# Patient Record
Sex: Male | Born: 1980 | Race: Black or African American | Hispanic: No | Marital: Single | Smoking: Never smoker
Health system: Southern US, Community
[De-identification: ages and names within clinical notes are randomized; demographics above are authoritative.]

## PROBLEM LIST (undated history)

## (undated) DIAGNOSIS — E119 Type 2 diabetes mellitus without complications: Secondary | ICD-10-CM

## (undated) DIAGNOSIS — I10 Essential (primary) hypertension: Secondary | ICD-10-CM

## (undated) HISTORY — PX: NO PAST SURGERIES: SHX2092

## (undated) HISTORY — DX: Essential (primary) hypertension: I10

---

## 2003-09-09 ENCOUNTER — Other Ambulatory Visit: Payer: Self-pay

## 2004-07-14 ENCOUNTER — Emergency Department: Payer: Self-pay | Admitting: General Practice

## 2012-10-29 ENCOUNTER — Ambulatory Visit (INDEPENDENT_AMBULATORY_CARE_PROVIDER_SITE_OTHER): Payer: PRIVATE HEALTH INSURANCE | Admitting: Family Medicine

## 2012-10-29 VITALS — BP 171/105 | HR 81 | Temp 99.0°F | Resp 18 | Ht 69.0 in | Wt >= 6400 oz

## 2012-10-29 DIAGNOSIS — I10 Essential (primary) hypertension: Secondary | ICD-10-CM

## 2012-10-29 MED ORDER — LISINOPRIL 10 MG PO TABS: 10.0000 mg | ORAL_TABLET | Freq: Every day | ORAL | Status: DC

## 2012-10-29 MED ORDER — LISINOPRIL-HYDROCHLOROTHIAZIDE 20-25 MG PO TABS: 1.0000 | ORAL_TABLET | Freq: Every day | ORAL | Status: DC

## 2012-10-29 NOTE — Progress Notes (Signed)
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  Over the last year he quit smoking and lost 80#!!!! He has no sx of shortness of breath or chest pain.  No edema Ran out of meds two days ago  Objective:  NAD Chest:  Clear Heart:  Reg, no murmur or gallop Ext:  No edema Skin:  No rash  Assessment:  Major life changes are helping.  Continue meds  Plan:

## 2012-10-29 NOTE — Patient Instructions (Signed)

## 2013-09-20 ENCOUNTER — Other Ambulatory Visit: Payer: Self-pay | Admitting: Family Medicine

## 2013-10-29 ENCOUNTER — Emergency Department: Payer: Self-pay | Admitting: Emergency Medicine

## 2014-08-28 ENCOUNTER — Emergency Department
Admit: 2014-08-28 | Disposition: A | Discharge status: Home / Self Care | Payer: Self-pay | Admitting: Emergency Medicine

## 2014-08-28 ENCOUNTER — Ambulatory Visit
Admit: 2014-08-28 | Disposition: A | Discharge status: Home / Self Care | Payer: Self-pay | Attending: Family Medicine | Admitting: Family Medicine

## 2015-02-19 ENCOUNTER — Emergency Department
Admission: EM | Admit: 2015-02-19 | Discharge: 2015-02-19 | Disposition: A | Discharge status: Home / Self Care | Payer: Self-pay | Attending: Emergency Medicine | Admitting: Emergency Medicine

## 2015-02-19 DIAGNOSIS — Z792 Long term (current) use of antibiotics: Secondary | ICD-10-CM | POA: Insufficient documentation

## 2015-02-19 DIAGNOSIS — Z87891 Personal history of nicotine dependence: Secondary | ICD-10-CM | POA: Insufficient documentation

## 2015-02-19 DIAGNOSIS — I10 Essential (primary) hypertension: Secondary | ICD-10-CM | POA: Insufficient documentation

## 2015-02-19 DIAGNOSIS — Z79899 Other long term (current) drug therapy: Secondary | ICD-10-CM | POA: Insufficient documentation

## 2015-02-19 DIAGNOSIS — H6092 Unspecified otitis externa, left ear: Secondary | ICD-10-CM | POA: Insufficient documentation

## 2015-02-19 MED ORDER — HYDROCODONE-ACETAMINOPHEN 5-325 MG PO TABS
1.0000 | ORAL_TABLET | Freq: Once | ORAL | Status: AC
  Administered 2015-02-19: 1 via ORAL
  Filled 2015-02-19: qty 1

## 2015-02-19 MED ORDER — CIPROFLOXACIN-DEXAMETHASONE 0.3-0.1 % OT SUSP: 4.0000 [drp] | Freq: Two times a day (BID) | OTIC | Status: AC

## 2015-02-19 MED ORDER — HYDROCODONE-ACETAMINOPHEN 5-325 MG PO TABS: 1.0000 | ORAL_TABLET | Freq: Four times a day (QID) | ORAL | Status: DC | PRN

## 2015-02-19 NOTE — ED Notes (Signed)
Left ear ache x 4 days

## 2015-02-19 NOTE — ED Provider Notes (Signed)
[facility] Emergency Department Provider Note ____________________________________________  Time seen: 0900  I have reviewed the triage vital signs and the nursing notes.  HISTORY  Chief Complaint  Otalgia  HPI Derek Payne is a 34 y.o. male reports to the ED for evaluation of a left earache for the last 4 days. He describes decreased hearing on the left as well as some ringing and throbbing pain. He reports the pain is also intermittently sharp. He has tried applying olive oil as well as some over-the-counter eardrops without relief to his symptoms. He notes some scant yellowish drainage from the ears well. He's been without any fevers, chills, or sweats in the interim.He notes his pain at a 10/10 in triage.  Past Medical History  Diagnosis Date  . Hypertension     There are no active problems to display for this patient.   No past surgical history on file.  Current Outpatient Rx  Name  Route  Sig  Dispense  Refill  . amoxicillin (AMOXIL) 500 MG capsule   Oral   Take 500 mg by mouth 3 (three) times daily.         . ciprofloxacin-dexamethasone (CIPRODEX) otic suspension   Left Ear   Place 4 drops into the left ear 2 (two) times daily.   7.5 mL   0   . HYDROcodone-acetaminophen (NORCO) 5-325 MG tablet   Oral   Take 1-2 tablets by mouth every 6 (six) hours as needed for moderate pain.   12 tablet   0   . lisinopril (PRINIVIL,ZESTRIL) 10 MG tablet   Oral   Take 1 tablet (10 mg total) by mouth daily.   90 tablet   3   . lisinopril-hydrochlorothiazide (PRINZIDE,ZESTORETIC) 20-25 MG per tablet   Oral   Take 1 tablet by mouth daily.   90 tablet   3    Allergies Review of patient's allergies indicates no known allergies.  Family History  Problem Relation Age of Onset  . Hypertension Mother     Social History Social History  Substance Use Topics  . Smoking status: Former Games developer  . Smokeless tobacco: Not on file  . Alcohol  Use: No   Review of Systems  Constitutional: Negative for fever. Eyes: Negative for visual changes. ENT: Negative for sore throat. Left earache as above. Cardiovascular: Negative for chest pain. Respiratory: Negative for shortness of breath. Gastrointestinal: Negative for abdominal pain, vomiting and diarrhea. Genitourinary: Negative for dysuria. Musculoskeletal: Negative for back pain. Skin: Negative for rash. Neurological: Negative for headaches, focal weakness or numbness. ____________________________________________  PHYSICAL EXAM:  VITAL SIGNS: ED Triage Vitals  Enc Vitals Group     BP 02/19/15 0812 138/83 mmHg     Pulse Rate 02/19/15 0812 93     Resp 02/19/15 0812 18     Temp 02/19/15 0812 98.6 F (37 C)     Temp Source 02/19/15 0812 Oral     SpO2 02/19/15 0812 96 %     Weight 02/19/15 0812 390 lb (176.903 kg)     Height 02/19/15 0812  (1.778 m)     Head Cir --      Peak Flow --      Pain Score 02/19/15 0813 10     Pain Loc --      Pain Edu? --      Excl. in GC? --    Constitutional: Alert and oriented. Well appearing and in no distress. Head: Normocephalic and atraumatic.  Eyes: Conjunctivae are normal. PERRL. Normal extraocular movements      Ears: left canal edematous, with serous material noted distally. The left TM is not visualized on exam. Patient tender to manipulation of the tragus and the pinna on the left ear.   Nose: No congestion/rhinorrhea.   Mouth/Throat: Mucous membranes are moist.   Neck: Supple. No thyromegaly. Hematological/Lymphatic/Immunological: No cervical or preauricular lymphadenopathy. Cardiovascular: Normal rate, regular rhythm.  Respiratory: Normal respiratory effort. No wheezes/rales/rhonchi. Gastrointestinal: Soft and nontender. No distention. Musculoskeletal: Nontender with normal range of motion in all extremities.  Neurologic:  Normal gait without ataxia. Normal speech and language. No gross focal neurologic  deficits are appreciated. Skin:  Skin is warm, dry and intact. No rash noted. Psychiatric: Mood and affect are normal. Patient exhibits appropriate insight and judgment. ____________________________________________  PROCEDURES  Ear wick instilled in the left canal ____________________________________________  INITIAL IMPRESSION / ASSESSMENT AND PLAN / ED COURSE  Patient discharge with a left otitis externa. He's provided with prescriptions for Ciprodex solution and Vicodin for pain. He is to follow-up with Dr. Elenore Rota for ongoing symptoms.  ____________________________________________  FINAL CLINICAL IMPRESSION(S) / ED DIAGNOSES  Final diagnoses:  Otitis externa of left ear      Lissa Hoard, PA-C 02/19/15 1557  Arnaldo Natal, MD 02/19/15 [phone]

## 2015-02-19 NOTE — Discharge Instructions (Signed)
Otitis Externa Otitis externa is a bacterial or fungal infection of the outer ear canal. This is the area from the eardrum to the outside of the ear. Otitis externa is sometimes called "swimmer's ear." CAUSES  Possible causes of infection include:  Swimming in dirty water.  Moisture remaining in the ear after swimming or bathing.  Mild injury (trauma) to the ear.  Objects stuck in the ear (foreign body).  Cuts or scrapes (abrasions) on the outside of the ear. SIGNS AND SYMPTOMS  The first symptom of infection is often itching in the ear canal. Later signs and symptoms may include swelling and redness of the ear canal, ear pain, and yellowish-white fluid (pus) coming from the ear. The ear pain may be worse when pulling on the earlobe. DIAGNOSIS  Your health care provider will perform a physical exam. A sample of fluid may be taken from the ear and examined for bacteria or fungi. TREATMENT  Antibiotic ear drops are often given for 10 to 14 days. Treatment may also include pain medicine or corticosteroids to reduce itching and swelling. HOME CARE INSTRUCTIONS   Apply antibiotic ear drops to the ear canal as prescribed by your health care provider.  Take medicines only as directed by your health care provider.  If you have diabetes, follow any additional treatment instructions from your health care provider.  Keep all follow-up visits as directed by your health care provider. PREVENTION   Keep your ear dry. Use the corner of a towel to absorb water out of the ear canal after swimming or bathing.  Avoid scratching or putting objects inside your ear. This can damage the ear canal or remove the protective wax that lines the canal. This makes it easier for bacteria and fungi to grow.  Avoid swimming in lakes, polluted water, or poorly chlorinated pools.  You may use ear drops made of rubbing alcohol and vinegar after swimming. Combine equal parts of white vinegar and alcohol in a bottle.  Put 3 or 4 drops into each ear after swimming. SEEK MEDICAL CARE IF:   You have a fever.  Your ear is still red, swollen, painful, or draining pus after 3 days.  Your redness, swelling, or pain gets worse.  You have a severe headache.  You have redness, swelling, pain, or tenderness in the area behind your ear. MAKE SURE YOU:   Understand these instructions.  Will watch your condition.  Will get help right away if you are not doing well or get worse.   This information is not intended to replace advice given to you by your health care provider. Make sure you discuss any questions you have with your health care provider.   Document Released: 04/29/2005 Document Revised: 05/20/2014 Document Reviewed: 05/16/2011 Elsevier Interactive Patient Education 2016 [place] Drops, Adult You need to put eardrops in your ear. HOME CARE   Put drops in your affected ear as told.  After putting in the drops, lie down with the ear you put the drops in facing up. Stay this way for 10 minutes. Use the ear drops as long as your doctor tells you.  Before you get up, put a cotton ball gently in your ear. Do not push it far in your ear.  Do not wash out your ears unless your doctor says it is okay.  Finish all medicines as told by your doctor. You may be told to keep using the eardrops even if you start to feel better.  See  your doctor as told for follow-up visits. GET HELP IF:  You have pain that gets worse.  Any unusual fluid (drainage) is coming from your ear (especially if the fluid stinks).  You have trouble hearing.  You get really dizzy as if the room is spinning and feel sick to your stomach (vertigo).  The outside of your ear becomes red or puffy or both. This may be a sign of an allergic reaction. MAKE SURE YOU:   Understand these instructions.  Will watch your condition.  Will get help right away if you are not doing well or get worse.   This information is not  intended to replace advice given to you by your health care provider. Make sure you discuss any questions you have with your health care provider.   Document Released: 10/17/2009 Document Revised: 05/20/2014 Document Reviewed: 11/24/2012 Elsevier Interactive Patient Education [employer]  Use the ear drops as directed until 10 days. Follow-up with Dr. Elenore Rota as discussed.

## 2016-01-22 ENCOUNTER — Ambulatory Visit: Payer: Self-pay | Admitting: *Deleted

## 2017-07-25 ENCOUNTER — Encounter: Payer: Self-pay | Admitting: *Deleted

## 2017-07-25 ENCOUNTER — Other Ambulatory Visit: Payer: Self-pay

## 2017-07-25 ENCOUNTER — Ambulatory Visit
Admission: EM | Admit: 2017-07-25 | Discharge: 2017-07-25 | Disposition: A | Discharge status: Home / Self Care | Payer: BLUE CROSS/BLUE SHIELD | Attending: Family Medicine | Admitting: Family Medicine

## 2017-07-25 DIAGNOSIS — I10 Essential (primary) hypertension: Secondary | ICD-10-CM

## 2017-07-25 DIAGNOSIS — E119 Type 2 diabetes mellitus without complications: Secondary | ICD-10-CM | POA: Diagnosis not present

## 2017-07-25 DIAGNOSIS — Z760 Encounter for issue of repeat prescription: Secondary | ICD-10-CM | POA: Diagnosis not present

## 2017-07-25 HISTORY — DX: Type 2 diabetes mellitus without complications: E11.9

## 2017-07-25 MED ORDER — AMLODIPINE BESYLATE 10 MG PO TABS: 10.0000 mg | ORAL_TABLET | Freq: Every day | ORAL | 1 refills | Status: DC

## 2017-07-25 MED ORDER — METFORMIN HCL 500 MG PO TABS: 500.0000 mg | ORAL_TABLET | Freq: Every day | ORAL | 1 refills | Status: DC

## 2017-07-25 MED ORDER — HYDROCHLOROTHIAZIDE 25 MG PO TABS: 25.0000 mg | ORAL_TABLET | Freq: Every day | ORAL | 1 refills | Status: DC

## 2017-07-25 NOTE — ED Triage Notes (Signed)
Patient has been out of his BP medications for 2 days days, he also is out of metformin. PCP is no longer in his health insurance.

## 2017-07-25 NOTE — Discharge Instructions (Signed)
Meds refilled.  Be sure to see your primary.  Take care  Dr. Adriana Simasook

## 2017-07-25 NOTE — ED Provider Notes (Signed)
[facility]  CSN: [id number] Arrival date & time: 07/25/17  1122  History   Chief Complaint Chief Complaint  Patient presents with  . Hypertension   HPI  37 year old male presents for a medication refill.  Patient states that his insurance has recently changed and he is in the process of switching primary care providers.  He states that he has been out of his medication for the past 2 days.  Regarding his hypertension, this has been stable.  He is currently on amlodipine and HCTZ and tolerating without difficulty.  Patient is requesting refill today.  In regards to his diabetes, he has not had recent labs.  He is taking metformin just once daily.  Tolerating without difficulty.  No reports of side effects.  Patient requesting refill today.  Past Medical History:  Diagnosis Date  . Diabetes mellitus without complication (HCC)   . Hypertension   Morbid obesity OSA GERD  Surgical Hx - No past surgeries.  Home Medications    Prior to Admission medications   Medication Sig Start Date End Date Taking? Authorizing Provider  amLODipine (NORVASC) 10 MG tablet Take 1 tablet (10 mg total) by mouth daily. 07/25/17   Tommie Samsook, Shepard Keltz G, DO  hydrochlorothiazide (HYDRODIURIL) 25 MG tablet Take 1 tablet (25 mg total) by mouth daily. 07/25/17   Tommie Samsook, Asaf Elmquist G, DO  metFORMIN (GLUCOPHAGE) 500 MG tablet Take 1 tablet (500 mg total) by mouth daily. 07/25/17   Tommie Samsook, Nikitta Sobiech G, DO   Family History Sleep apnea Father    Arthritis Mother    High blood pressure (Hypertension) Mother     Social History Social History   Tobacco Use  . Smoking status: Former Games developermoker  . Smokeless tobacco: Never Used  Substance Use Topics  . Alcohol use: No  . Drug use: No   Allergies   Patient has no known allergies.   Review of Systems Review of Systems  Constitutional: Negative.   Respiratory: Negative.   Cardiovascular: Negative.    Physical Exam Triage Vital Signs ED Triage Vitals  Enc  Vitals Group     BP 07/25/17 1154 (!) 157/85     Pulse Rate 07/25/17 1154 72     Resp 07/25/17 1154 18     Temp 07/25/17 1154 98.3 F (36.8 C)     Temp Source 07/25/17 1154 Oral     SpO2 07/25/17 1154 97 %     Weight 07/25/17 1157 (!) 470 lb (213.2 kg)     Height 07/25/17 1157 5\' 8"  (1.727 m)     Head Circumference --      Peak Flow --      Pain Score 07/25/17 1157 0     Pain Loc --      Pain Edu? --      Excl. in GC? --    Updated Vital Signs BP (!) 157/85 (BP Location: Left Arm)   Pulse 72   Temp 98.3 F (36.8 C) (Oral)   Resp 18   Ht 5\' 8"  (1.727 m)   Wt (!) 470 lb (213.2 kg)   SpO2 97%   BMI 71.46 kg/m    Physical Exam  Constitutional: He is oriented to person, place, and time. He appears well-developed. No distress.  HENT:  Head: Normocephalic and atraumatic.  Cardiovascular: Normal rate and regular rhythm.  Pulmonary/Chest: Effort normal and breath sounds normal. He has no wheezes. He has no rales.  Neurological: He is alert and oriented to person, place, and time.  Psychiatric: He has a normal mood and affect. His behavior is normal.  Nursing note and vitals reviewed.  UC Treatments / Results  Labs (all labs ordered are listed, but only abnormal results are displayed) Labs Reviewed - No data to display  EKG  EKG Interpretation None       Radiology No results found.  Procedures Procedures (including critical care time)  Medications Ordered in UC Medications - No data to display   Initial Impression / Assessment and Plan / UC Course  I have reviewed the triage vital signs and the nursing notes.  Pertinent labs & imaging results that were available during my care of the patient were reviewed by me and considered in my medical decision making (see chart for details).    37 year old male presents with stable HTN and DM-2. Meds refilled. Needs labs. Follow up with primary care.  Final Clinical Impressions(s) / UC Diagnoses   Final diagnoses:    Essential hypertension  Type 2 diabetes mellitus without complication, without long-term current use of insulin [facility])    ED Discharge Orders        Ordered    amLODipine (NORVASC) 10 MG tablet  Daily     07/25/17 1250    hydrochlorothiazide (HYDRODIURIL) 25 MG tablet  Daily     07/25/17 1250    metFORMIN (GLUCOPHAGE) 500 MG tablet  Daily     07/25/17 1250     Controlled Substance Prescriptions [place] Controlled Substance Registry consulted? Not Applicable   Tommie Sams, DO 07/25/17 1321

## 2018-01-24 ENCOUNTER — Other Ambulatory Visit: Payer: Self-pay | Admitting: Family Medicine

## 2018-01-27 NOTE — Telephone Encounter (Signed)
30 day supply with no refills pt is due for an OV for more refills

## 2018-03-02 ENCOUNTER — Other Ambulatory Visit: Payer: Self-pay | Admitting: Family Medicine

## 2018-06-17 ENCOUNTER — Ambulatory Visit
Admission: EM | Admit: 2018-06-17 | Discharge: 2018-06-17 | Disposition: A | Discharge status: Home / Self Care | Payer: BLUE CROSS/BLUE SHIELD | Attending: Family Medicine | Admitting: Family Medicine

## 2018-06-17 ENCOUNTER — Other Ambulatory Visit: Payer: Self-pay

## 2018-06-17 ENCOUNTER — Encounter: Payer: Self-pay | Admitting: Emergency Medicine

## 2018-06-17 DIAGNOSIS — I10 Essential (primary) hypertension: Secondary | ICD-10-CM | POA: Diagnosis not present

## 2018-06-17 DIAGNOSIS — Z6841 Body Mass Index (BMI) 40.0 and over, adult: Secondary | ICD-10-CM

## 2018-06-17 DIAGNOSIS — E1169 Type 2 diabetes mellitus with other specified complication: Secondary | ICD-10-CM | POA: Diagnosis not present

## 2018-06-17 DIAGNOSIS — E669 Obesity, unspecified: Secondary | ICD-10-CM | POA: Diagnosis not present

## 2018-06-17 LAB — COMPREHENSIVE METABOLIC PANEL
ALBUMIN: 3.7 g/dL (ref 3.5–5.0)
ALT: 25 U/L (ref 0–44)
AST: 22 U/L (ref 15–41)
Alkaline Phosphatase: 59 U/L (ref 38–126)
Anion gap: 7 (ref 5–15)
BUN: 14 mg/dL (ref 6–20)
CHLORIDE: 107 mmol/L (ref 98–111)
CO2: 24 mmol/L (ref 22–32)
Calcium: 8.8 mg/dL — ABNORMAL LOW (ref 8.9–10.3)
Creatinine, Ser: 0.85 mg/dL (ref 0.61–1.24)
GFR calc Af Amer: >60 mL/min (ref >60)
GFR calc non Af Amer: >60 mL/min (ref >60)
GLUCOSE: 101 mg/dL — AB (ref 70–99)
POTASSIUM: 3.6 mmol/L (ref 3.5–5.1)
Sodium: 138 mmol/L (ref 135–145)
Total Bilirubin: 0.5 mg/dL (ref 0.3–1.2)
Total Protein: 7.9 g/dL (ref 6.5–8.1)

## 2018-06-17 MED ORDER — HYDROCHLOROTHIAZIDE 25 MG PO TABS: 25.0000 mg | ORAL_TABLET | Freq: Every day | ORAL | 0 refills | Status: AC

## 2018-06-17 MED ORDER — METFORMIN HCL 500 MG PO TABS: 500.0000 mg | ORAL_TABLET | Freq: Two times a day (BID) | ORAL | 0 refills | Status: AC

## 2018-06-17 NOTE — ED Provider Notes (Signed)
[facility]    CSN: [id number] Arrival date & time: 06/17/18  1355     History   Chief Complaint Chief Complaint  Patient presents with  . Dizziness  . Hypertension  . Diabetes    HPI Derek Payne is a 38 y.o. male.   38 yo male with a c/o "refills on my metformin and HCTZ". States he ran out 2 days ago and is changing PCPs. Denies any fevers, chills, chest pains, shortness of breath. States yesterday felt a little dizzy but none since.   The history is provided by the patient.    Past Medical History:  Diagnosis Date  . Diabetes mellitus without complication (HCC)   . Hypertension     There are no active problems to display for this patient.   Past Surgical History:  Procedure Laterality Date  . NO PAST SURGERIES         Home Medications    Prior to Admission medications   Medication Sig Start Date End Date Taking? Authorizing Provider  amLODipine (NORVASC) 10 MG tablet TAKE 1 TABLET(10 MG) BY MOUTH DAILY 01/27/18  Yes Cook, Jayce G, DO  hydrochlorothiazide (HYDRODIURIL) 25 MG tablet Take 1 tablet (25 mg total) by mouth daily. 06/17/18   Payton Mccallum, MD  metFORMIN (GLUCOPHAGE) 500 MG tablet Take 1 tablet (500 mg total) by mouth 2 (two) times daily with a meal. 06/17/18   Payton Mccallum, MD    Family History Family History  Problem Relation Age of Onset  . Hypertension Mother     Social History Social History   Tobacco Use  . Smoking status: Never Smoker  . Smokeless tobacco: Never Used  Substance Use Topics  . Alcohol use: No  . Drug use: No     Allergies   Lisinopril   Review of Systems Review of Systems   Physical Exam Triage Vital Signs ED Triage Vitals  Enc Vitals Group     BP 06/17/18 1454 (!) 162/88     Pulse Rate 06/17/18 1454 73     Resp 06/17/18 1454 18     Temp 06/17/18 1454 98.8 F (37.1 C)     Temp Source 06/17/18 1454 Oral     SpO2 06/17/18 1454 96 %     Weight 06/17/18 1451 (!) 488 lb (221.4 kg)     Height 06/17/18 1451 5\' 8"  (1.727 m)     Head Circumference --      Peak Flow --      Pain Score 06/17/18 1451 0     Pain Loc --      Pain Edu? --      Excl. in GC? --    No data found.  Updated Vital Signs BP (!) 162/88 (BP Location: Left Arm)   Pulse 73   Temp 98.8 F (37.1 C) (Oral)   Resp 18   Ht 5\' 8"  (1.727 m)   Wt (!) 221.4 kg   SpO2 96%   BMI 74.20 kg/m   Visual Acuity Right Eye Distance:   Left Eye Distance:   Bilateral Distance:    Right Eye Near:   Left Eye Near:    Bilateral Near:     Physical Exam Vitals signs and nursing note reviewed.  Constitutional:      General: He is not in acute distress.    Appearance: He is not toxic-appearing or diaphoretic.  Cardiovascular:     Rate and Rhythm: Normal rate and regular rhythm.  Pulmonary:  Effort: Pulmonary effort is normal. No respiratory distress.     Breath sounds: Normal breath sounds. No stridor. No wheezing, rhonchi or rales.  Neurological:     Mental Status: He is alert.      UC Treatments / Results  Labs (all labs ordered are listed, but only abnormal results are displayed) Labs Reviewed  COMPREHENSIVE METABOLIC PANEL - Abnormal; Notable for the following components:      Result Value   Glucose, Bld 101 (*)    Calcium 8.8 (*)    All other components within normal limits    EKG None  Radiology No results found.  Procedures Procedures (including critical care time)  Medications Ordered in UC Medications - No data to display  Initial Impression / Assessment and Plan / UC Course  I have reviewed the triage vital signs and the nursing notes.  Pertinent labs & imaging results that were available during my care of the patient were reviewed by me and considered in my medical decision making (see chart for details).      Final Clinical Impressions(s) / UC Diagnoses   Final diagnoses:  Diabetes mellitus type 2 in obese [facility])  Essential hypertension    ED Prescriptions     Medication Sig Dispense Auth. Provider   metFORMIN (GLUCOPHAGE) 500 MG tablet Take 1 tablet (500 mg total) by mouth 2 (two) times daily with a meal. 60 tablet Shadie Sweatman, MD   hydrochlorothiazide (HYDRODIURIL) 25 MG tablet Take 1 tablet (25 mg total) by mouth daily. 30 tablet Payton Mccallum, MD      1. Lab results and diagnosis reviewed with patient 2. rx as per orders above; reviewed possible side effects, interactions, risks and benefits  3. Follow up with PCP 4. Follow-up here prn   Controlled Substance Prescriptions [place] Controlled Substance Registry consulted? Not Applicable   Payton Mccallum, MD 06/17/18 [phone]

## 2018-06-17 NOTE — ED Triage Notes (Signed)
Pt present to [facility] for refills on his Metformin 500 mg QD and HCTZ 25 mg. He has been out of these for 2 days and had a "dizzy spell" yesterday morning. He changed insurance his PCP no longer takes his insurance and could not get in with another until April.

## 2018-06-17 NOTE — Discharge Instructions (Signed)
Follow up with Primary Care Provider °

## 2019-04-21 ENCOUNTER — Ambulatory Visit: Payer: Self-pay

## 2019-04-21 ENCOUNTER — Other Ambulatory Visit: Payer: Self-pay

## 2019-04-21 DIAGNOSIS — Z021 Encounter for pre-employment examination: Secondary | ICD-10-CM

## 2019-04-21 LAB — POCT URINE DRUG SCREEN
POC Amphetamine UR: NOT DETECTED
POC Cocaine UR: NOT DETECTED
POC Marijuana UR: NOT DETECTED
POC Methamphetamine UR: NOT DETECTED
POC Opiate Ur: NOT DETECTED
POC PHENCYCLIDINE UR: NOT DETECTED
URINE TEMPERATURE: 96 Degrees F (ref 90.0–100.0)

## 2019-04-21 NOTE — Progress Notes (Signed)
     Patient ID: Derek Payne, male    DOB: [birth year], 38 y.o.   MRN: [id number]    Thank you!!  [place] Nurse Specialist [place]: [phone]  Cell:  [phone] Website: [email or web address]

## 2020-04-17 ENCOUNTER — Encounter: Payer: Self-pay | Admitting: Emergency Medicine

## 2020-04-17 ENCOUNTER — Other Ambulatory Visit: Payer: Self-pay

## 2020-04-17 ENCOUNTER — Ambulatory Visit
Admission: EM | Admit: 2020-04-17 | Discharge: 2020-04-17 | Disposition: A | Discharge status: Other Acute Inpatient Hospital | Payer: HRSA Program | Attending: Physician Assistant | Admitting: Physician Assistant

## 2020-04-17 ENCOUNTER — Ambulatory Visit (INDEPENDENT_AMBULATORY_CARE_PROVIDER_SITE_OTHER): Payer: HRSA Program

## 2020-04-17 ENCOUNTER — Inpatient Hospital Stay
Admission: EM | Admit: 2020-04-17 | Discharge: 2020-04-18 | DRG: 177 | Disposition: A | Discharge status: Home / Self Care | Payer: HRSA Program | Attending: Internal Medicine | Admitting: Internal Medicine

## 2020-04-17 DIAGNOSIS — J9601 Acute respiratory failure with hypoxia: Secondary | ICD-10-CM | POA: Diagnosis present

## 2020-04-17 DIAGNOSIS — Z7984 Long term (current) use of oral hypoglycemic drugs: Secondary | ICD-10-CM

## 2020-04-17 DIAGNOSIS — N179 Acute kidney failure, unspecified: Secondary | ICD-10-CM | POA: Diagnosis present

## 2020-04-17 DIAGNOSIS — Z8249 Family history of ischemic heart disease and other diseases of the circulatory system: Secondary | ICD-10-CM | POA: Diagnosis not present

## 2020-04-17 DIAGNOSIS — I10 Essential (primary) hypertension: Secondary | ICD-10-CM | POA: Diagnosis present

## 2020-04-17 DIAGNOSIS — Z888 Allergy status to other drugs, medicaments and biological substances status: Secondary | ICD-10-CM

## 2020-04-17 DIAGNOSIS — U071 COVID-19: Secondary | ICD-10-CM

## 2020-04-17 DIAGNOSIS — E119 Type 2 diabetes mellitus without complications: Secondary | ICD-10-CM | POA: Diagnosis present

## 2020-04-17 DIAGNOSIS — R778 Other specified abnormalities of plasma proteins: Secondary | ICD-10-CM

## 2020-04-17 DIAGNOSIS — Z6841 Body Mass Index (BMI) 40.0 and over, adult: Secondary | ICD-10-CM | POA: Diagnosis not present

## 2020-04-17 DIAGNOSIS — J1282 Pneumonia due to coronavirus disease 2019: Secondary | ICD-10-CM | POA: Insufficient documentation

## 2020-04-17 DIAGNOSIS — R509 Fever, unspecified: Secondary | ICD-10-CM

## 2020-04-17 DIAGNOSIS — Z79899 Other long term (current) drug therapy: Secondary | ICD-10-CM

## 2020-04-17 DIAGNOSIS — E860 Dehydration: Secondary | ICD-10-CM

## 2020-04-17 DIAGNOSIS — R059 Cough, unspecified: Secondary | ICD-10-CM

## 2020-04-17 DIAGNOSIS — R7989 Other specified abnormal findings of blood chemistry: Secondary | ICD-10-CM

## 2020-04-17 LAB — BLOOD GAS, VENOUS
Acid-Base Excess: 2.4 mmol/L — ABNORMAL HIGH (ref 0.0–2.0)
Bicarbonate: 26.4 mmol/L (ref 20.0–28.0)
O2 Saturation: 81.1 %
Patient temperature: 37
pCO2, Ven: 38 mmHg — ABNORMAL LOW (ref 44.0–60.0)
pH, Ven: 7.45 — ABNORMAL HIGH (ref 7.250–7.430)
pO2, Ven: 43 mmHg (ref 32.0–45.0)

## 2020-04-17 LAB — GLUCOSE, CAPILLARY: Glucose-Capillary: 117 mg/dL — ABNORMAL HIGH (ref 70–99)

## 2020-04-17 LAB — CBC WITH DIFFERENTIAL/PLATELET
Abs Immature Granulocytes: 0.03 10*3/uL (ref 0.00–0.07)
Basophils Absolute: 0 10*3/uL (ref 0.0–0.1)
Basophils Relative: 0 %
Eosinophils Absolute: 0 10*3/uL (ref 0.0–0.5)
Eosinophils Relative: 0 %
HCT: 48.2 % (ref 39.0–52.0)
Hemoglobin: 15.9 g/dL (ref 13.0–17.0)
Immature Granulocytes: 1 %
Lymphocytes Relative: 35 %
Lymphs Abs: 1.8 10*3/uL (ref 0.7–4.0)
MCH: 26.9 pg (ref 26.0–34.0)
MCHC: 33 g/dL (ref 30.0–36.0)
MCV: 81.7 fL (ref 80.0–100.0)
Monocytes Absolute: 0.5 10*3/uL (ref 0.1–1.0)
Monocytes Relative: 9 %
Neutro Abs: 2.9 10*3/uL (ref 1.7–7.7)
Neutrophils Relative %: 55 %
Platelets: 227 10*3/uL (ref 150–400)
RBC Morphology: NONE SEEN
RBC: 5.9 MIL/uL — ABNORMAL HIGH (ref 4.22–5.81)
RDW: 14.6 % (ref 11.5–15.5)
Smear Review: NORMAL
WBC Morphology: >10
WBC: 5.3 10*3/uL (ref 4.0–10.5)
nRBC: 0 % (ref 0.0–0.2)

## 2020-04-17 LAB — RESP PANEL BY RT-PCR (FLU A&B, COVID) ARPGX2
Influenza A by PCR: NEGATIVE
Influenza B by PCR: NEGATIVE
SARS Coronavirus 2 by RT PCR: POSITIVE — AB

## 2020-04-17 LAB — COMPREHENSIVE METABOLIC PANEL
ALT: 82 U/L — ABNORMAL HIGH (ref 0–44)
AST: 83 U/L — ABNORMAL HIGH (ref 15–41)
Albumin: 3.6 g/dL (ref 3.5–5.0)
Alkaline Phosphatase: 49 U/L (ref 38–126)
Anion gap: 15 (ref 5–15)
BUN: 13 mg/dL (ref 6–20)
CO2: 22 mmol/L (ref 22–32)
Calcium: 8.6 mg/dL — ABNORMAL LOW (ref 8.9–10.3)
Chloride: 98 mmol/L (ref 98–111)
Creatinine, Ser: 1.83 mg/dL — ABNORMAL HIGH (ref 0.61–1.24)
GFR, Estimated: 47 mL/min — ABNORMAL LOW (ref >60)
Glucose, Bld: 119 mg/dL — ABNORMAL HIGH (ref 70–99)
Potassium: 3.5 mmol/L (ref 3.5–5.1)
Sodium: 135 mmol/L (ref 135–145)
Total Bilirubin: 0.7 mg/dL (ref 0.3–1.2)
Total Protein: 8.3 g/dL — ABNORMAL HIGH (ref 6.5–8.1)

## 2020-04-17 LAB — PROCALCITONIN: Procalcitonin: 0.11 ng/mL

## 2020-04-17 LAB — TROPONIN I (HIGH SENSITIVITY): Troponin I (High Sensitivity): 55 ng/L — ABNORMAL HIGH (ref <18)

## 2020-04-17 LAB — MAGNESIUM: Magnesium: 2 mg/dL (ref 1.7–2.4)

## 2020-04-17 LAB — BRAIN NATRIURETIC PEPTIDE: B Natriuretic Peptide: 19.1 pg/mL (ref 0.0–100.0)

## 2020-04-17 MED ORDER — LACTATED RINGERS IV BOLUS
1000.0000 mL | Freq: Once | INTRAVENOUS | Status: AC
  Administered 2020-04-17: 1000 mL via INTRAVENOUS

## 2020-04-17 MED ORDER — SODIUM CHLORIDE 0.9 % IV SOLN
200.0000 mg | Freq: Once | INTRAVENOUS | Status: AC
  Administered 2020-04-17: 200 mg via INTRAVENOUS
  Filled 2020-04-17: qty 200

## 2020-04-17 MED ORDER — SODIUM CHLORIDE 0.9 % IV SOLN
100.0000 mg | Freq: Every day | INTRAVENOUS | Status: DC
  Administered 2020-04-18: 100 mg via INTRAVENOUS
  Filled 2020-04-17: qty 20

## 2020-04-17 MED ORDER — DEXAMETHASONE SODIUM PHOSPHATE 10 MG/ML IJ SOLN
6.0000 mg | Freq: Once | INTRAMUSCULAR | Status: AC
  Administered 2020-04-17: 6 mg via INTRAVENOUS
  Filled 2020-04-17: qty 1

## 2020-04-17 MED ORDER — ACETAMINOPHEN 500 MG PO TABS
1000.0000 mg | ORAL_TABLET | ORAL | Status: AC
  Administered 2020-04-17: 1000 mg via ORAL

## 2020-04-17 MED ORDER — ASPIRIN 81 MG PO CHEW
324.0000 mg | CHEWABLE_TABLET | Freq: Once | ORAL | Status: AC
  Administered 2020-04-17: 324 mg via ORAL
  Filled 2020-04-17: qty 4

## 2020-04-17 NOTE — H&P (Signed)
History and Physical    Mccauley Diehl YIF:[id number] DOB: [birth year] DOA: 04/17/2020  I have briefly reviewed the patient's prior medical records in [facility]  PCP: [facility]  Patient coming from: home  Chief Complaint: Shortness of breath  HPI: Derek Payne is a 39 y.o. male with medical history significant of hypertension, type 2 diabetes mellitus, morbid obesity, comes to the hospital with complaints of generalized weakness, shortness of breath.  He has been having a cough and has been feeling fatigued, weak for about 4 days.  He reports that his fiance is also sick with similar symptoms.  He reports fever and chills, went to urgent care today and was found to have COVID-19.  He was also found to be hypoxic and directed to the emergency room.  He is not vaccinated.  He also reports very poor appetite and decreased p.o. intake as well as decreased urine output.  He denies any loss of taste or smell.  No abdominal pain, no nausea or vomiting.  No diarrhea.  ED Course: In the ER patient has a temp of 100, tachypneic with respiratory rate into the 20s-30s, blood pressure soft at times.  He is hypoxic satting in the upper 80s on room air requiring 2L supplemental oxygen.  Chest x-ray shows multifocal pneumonia.  Blood work shows a creatinine of 1.8, mildly elevated LFTs with AST/ALT in the 80s, high-sensitivity troponin 55 and BNP normal at 19.  He was given steroids, Remdesivir and we are asked for admission  Review of Systems: All systems reviewed, and apart from HPI, all negative  Past Medical History:  Diagnosis Date  . Diabetes mellitus without complication (HCC)   . Hypertension     Past Surgical History:  Procedure Laterality Date  . NO PAST SURGERIES       reports that he has never smoked. He has never used smokeless tobacco. He reports that he does not drink alcohol and does not use drugs.  Allergies  Allergen Reactions  . Lisinopril Swelling  and Hives    Family History  Problem Relation Age of Onset  . Hypertension Mother     Prior to Admission medications   Medication Sig Start Date End Date Taking? Authorizing Provider  amLODipine (NORVASC) 10 MG tablet TAKE 1 TABLET(10 MG) BY MOUTH DAILY 01/27/18   Everlene Other G, DO  hydrochlorothiazide (HYDRODIURIL) 25 MG tablet Take 1 tablet (25 mg total) by mouth daily. 06/17/18   Payton Mccallum, MD  metFORMIN (GLUCOPHAGE) 500 MG tablet Take 1 tablet (500 mg total) by mouth 2 (two) times daily with a meal. 06/17/18   Payton Mccallum, MD    Physical Exam: Vitals:   04/17/20 1854 04/17/20 1855 04/17/20 1900  BP: (!) 86/40  (!) 87/57  Pulse: 82  78  Resp:   10  Temp: 97.9 F (36.6 C)    TempSrc: Oral    SpO2: 93%  93%  Weight:  (!) 199.6 kg   Height:  5\' 8"  (1.727 m)     Constitutional: NAD, calm, comfortable Eyes: PERRL, lids and conjunctivae normal Neck: normal, supple Respiratory: Faint bibasilar rhonchi, no wheezing, overall difficult exam due to morbid obesity.  Tachypneic. Cardiovascular: Regular rate and rhythm, no murmurs / rubs / gallops. No extremity edema. Abdomen: no tenderness, no masses palpated. Bowel sounds positive.  Musculoskeletal: no clubbing / cyanosis. Normal muscle tone.  Skin: no rashes, lesions, ulcers. No induration Neurologic: CN 2-12 grossly intact. Strength 5/5 in all 4.  Psychiatric: Normal judgment and insight. Alert and oriented x 3. Normal mood.   Labs on Admission: I have personally reviewed following labs and imaging studies  CBC: Recent Labs  Lab 04/17/20 1737  WBC 5.3  NEUTROABS 2.9  HGB 15.9  HCT 48.2  MCV 81.7  PLT 227   Basic Metabolic Panel: Recent Labs  Lab 04/17/20 1737 04/17/20 1848  NA 135  --   K 3.5  --   CL 98  --   CO2 22  --   GLUCOSE 119*  --   BUN 13  --   CREATININE 1.83*  --   CALCIUM 8.6*  --   MG  --  2.0   Liver Function Tests: Recent Labs  Lab 04/17/20 1737  AST 83*  ALT 82*  ALKPHOS 49    BILITOT 0.7  PROT 8.3*  ALBUMIN 3.6   Coagulation Profile: No results for input(s): INR, PROTIME in the last 168 hours. BNP (last 3 results) No results for input(s): PROBNP in the last 8760 hours. CBG: Recent Labs  Lab 04/17/20 1711  GLUCAP 117*   Thyroid Function Tests: No results for input(s): TSH, T4TOTAL, FREET4, T3FREE, THYROIDAB in the last 72 hours. Urine analysis: No results found for: COLORURINE, APPEARANCEUR, LABSPEC, PHURINE, GLUCOSEU, HGBUR, BILIRUBINUR, KETONESUR, PROTEINUR, UROBILINOGEN, NITRITE, LEUKOCYTESUR   Radiological Exams on Admission: DG Chest 2 View  Result Date: 04/17/2020 CLINICAL DATA:  Fever, cough. EXAM: CHEST - 2 VIEW COMPARISON:  None. FINDINGS: The heart size and mediastinal contours are within normal limits. No pneumothorax or pleural effusion is noted. Minimal patchy opacities are noted in both lungs which may represent multifocal pneumonia. The visualized skeletal structures are unremarkable. IMPRESSION: Minimal patchy opacities are noted in both lungs which may represent multifocal pneumonia. Electronically Signed   By: Lupita Raider M.D.   On: 04/17/2020 18:11    EKG: Independently reviewed.  Sinus rhythm  Assessment/Plan  Principal Problem Acute hypoxic respiratory failure due to COVID-19 pneumonia -Currently minimal oxygen needs, patient started on remdesivir and steroids, continue.  Should oxygen increase by tomorrow he would be a candidate for Actemra/baricitinib -Incentive spirometry, flutter valve, prone as able, wean off oxygen as tolerated -Antitussives, vitamins, inhalers  Active Problems Acute kidney injury -Likely in the setting of poor p.o. intake and taking home diuretics -Provide IV fluids overnight, repeat renal function in the morning -Hold Metformin, HCTZ  Essential hypertension -Soft blood pressure in the ED, hold home medications  Type 2 diabetes mellitus -Start resistance sliding scale given obesity,  steroids  Morbid obesity -Based on BMI of 66, patient will benefit from weight loss   DVT prophylaxis: Lovenox Code Status: Full code Family Communication: No family at bedside Disposition Plan: Home when ready Bed Type: MedSurg Consults called: None Obs/Inp: Inpatient  At the time of admission, it appears that the appropriate admission status for this patient is INPATIENT as it is expected that patient will require hospital care > 2 midnights. This is judged to be reasonable and necessary in order to provide the required intensity of service to ensure the patient's safety given: presenting symptoms, initial radiographic and laboratory data and in the context of their chronic comorbidities. Together, these circumstances are felt to place patient at high at high risk for further clinical deterioration threatening life, limb, or organ.  Pamella Pert, MD, PhD [facility]  Contact via [email or web address]  04/17/2020, 9:04 PM

## 2020-04-17 NOTE — ED Notes (Signed)
Patient is being discharged from the Urgent Care and sent to the Emergency Department via EMS . Per Becky Augusta, NP, patient is in need of higher level of care due to hypoxia, COVID positive and COVID pneumonia. Patient is aware and verbalizes understanding of plan of care.  Vitals:   04/17/20 1738 04/17/20 1800  BP:    Pulse:  (!) 105  Resp: (S) (!) 36 (!) 36  Temp:  100 F (37.8 C)  SpO2:  95%

## 2020-04-17 NOTE — ED Provider Notes (Signed)
[facility]    CSN: [id number] Arrival date & time: 04/17/20  1630      History   Chief Complaint Chief Complaint  Patient presents with  . Cough  . Weakness    HPI Derek Payne is a 39 y.o. male.   HPI   39 year old male here for evaluation of cough and weakness.  Patient reports that he has been having a cough and feeling weak for the last 4 days.  He also feels like his blood sugar is off.  Blood sugar at triage was 117.  Patient has had a productive cough that is intermittently between clear and yellow.  He is also complaining of shortness of breath.  Patient reports that his girlfriend has similar symptoms but she is worse.  Patient denies wheezing, fever, ear pain or pressure, runny nose, nasal congestion, body aches, changes to sense of taste or smell.  Patient is febrile at 103.1, tachycardic at 106, tachypneic at 28, and satting 93% on room air.  Patient is sweating profusely in triage.  Patient Derek Payne that this is the first that he is been sweating.  Past Medical History:  Diagnosis Date  . Diabetes mellitus without complication (HCC)   . Hypertension     Patient Active Problem List   Diagnosis Date Noted  . Acute hypoxemic respiratory failure due to COVID-19 [facility]) 04/17/2020    Past Surgical History:  Procedure Laterality Date  . NO PAST SURGERIES         Home Medications    Prior to Admission medications   Medication Sig Start Date End Date Taking? Authorizing Provider  amLODipine (NORVASC) 10 MG tablet TAKE 1 TABLET(10 MG) BY MOUTH DAILY Patient taking differently: Take 10 mg by mouth daily.  01/27/18  Yes Cook, Jayce G, DO  hydrochlorothiazide (HYDRODIURIL) 25 MG tablet Take 1 tablet (25 mg total) by mouth daily. 06/17/18  Yes Payton Mccallum, MD  metFORMIN (GLUCOPHAGE) 500 MG tablet Take 1 tablet (500 mg total) by mouth 2 (two) times daily with a meal. 06/17/18  Yes Payton Mccallum, MD  acetaminophen (TYLENOL) 325 MG tablet Take 2 tablets  (650 mg total) by mouth every 6 (six) hours as needed for mild pain or headache (fever >/= 101). 04/18/20   Tresa Moore, MD  albuterol (VENTOLIN HFA) 108 (90 Base) MCG/ACT inhaler Inhale 2 puffs into the lungs every 6 (six) hours as needed for wheezing or shortness of breath. 04/18/20   Sreenath, Jonelle Sports, MD  benzonatate (TESSALON PERLES) 100 MG capsule Take 1 capsule (100 mg total) by mouth 3 (three) times daily as needed for cough. 04/18/20 04/18/21  Tresa Moore, MD  guaiFENesin-dextromethorphan (ROBITUSSIN DM) 100-10 MG/5ML syrup Take 10 mLs by mouth every 4 (four) hours as needed for cough. 04/18/20   Tresa Moore, MD  hydrALAZINE (APRESOLINE) 50 MG tablet Take 1 tablet by mouth in the morning and at bedtime. Patient not taking: Reported on 04/17/2020 04/05/19   [provider]  predniSONE (DELTASONE) 50 MG tablet Take 1 tablet (50 mg total) by mouth daily for 9 days. 04/21/20 04/30/20  Tresa Moore, MD    Family History Family History  Problem Relation Age of Onset  . Hypertension Mother     Social History Social History   Tobacco Use  . Smoking status: Never Smoker  . Smokeless tobacco: Never Used  Vaping Use  . Vaping Use: Never used  Substance Use Topics  . Alcohol use: No  . Drug  use: No     Allergies   Lisinopril   Review of Systems Review of Systems  Constitutional: Positive for fatigue. Negative for activity change, appetite change and fever.  HENT: Negative for congestion, ear pain, rhinorrhea, sinus pressure, sinus pain and sore throat.   Respiratory: Positive for cough and shortness of breath. Negative for wheezing.   Cardiovascular: Negative for chest pain.  Gastrointestinal: Negative for abdominal pain, diarrhea, nausea and vomiting.  Musculoskeletal: Negative for arthralgias and myalgias.  Skin: Negative for rash.  Neurological: Negative for headaches.  Hematological: Negative.   Psychiatric/Behavioral: Negative.       Physical Exam Triage Vital Signs ED Triage Vitals  Enc Vitals Group     BP      Pulse      Resp      Temp      Temp src      SpO2      Weight      Height      Head Circumference      Peak Flow      Pain Score      Pain Loc      Pain Edu?      Excl. in GC?    No data found.  Updated Vital Signs BP (!) 109/58 (BP Location: Right Arm)   Pulse (!) 105   Temp 100 F (37.8 C) (Oral)   Resp (!) 36   Ht  (1.727 m)   Wt (!) 440 lb (199.6 kg)   SpO2 95%   BMI 66.90 kg/m   Visual Acuity Right Eye Distance:   Left Eye Distance:   Bilateral Distance:    Right Eye Near:   Left Eye Near:    Bilateral Near:     Physical Exam Vitals and nursing note reviewed.  Constitutional:      Appearance: He is obese. He is ill-appearing and diaphoretic.  HENT:     Head: Normocephalic and atraumatic.     Right Ear: Tympanic membrane, ear canal and external ear normal.     Left Ear: Tympanic membrane, ear canal and external ear normal.     Nose: Congestion and rhinorrhea present.     Comments: Nasal mucosa is erythematous and mildly edematous with clear nasal discharge.    Mouth/Throat:     Mouth: Mucous membranes are moist.     Pharynx: Oropharynx is clear. Posterior oropharyngeal erythema present. No oropharyngeal exudate.     Comments: Posterior oropharynx has mild erythema and clear postnasal drip. Eyes:     General: No scleral icterus.    Extraocular Movements: Extraocular movements intact.     Conjunctiva/sclera: Conjunctivae normal.     Pupils: Pupils are equal, round, and reactive to light.  Cardiovascular:     Rate and Rhythm: Regular rhythm. Tachycardia present.     Pulses: Normal pulses.     Heart sounds: Normal heart sounds. No murmur heard.  No gallop.   Pulmonary:     Effort: Respiratory distress present.     Breath sounds: No wheezing, rhonchi or rales.     Comments: Patient is tachypneic.  Patient can speak in full sentences.  Lung sounds are clear  to auscultation in all fields. Musculoskeletal:        General: No swelling or tenderness. Normal range of motion.  Skin:    General: Skin is warm.     Capillary Refill: Capillary refill takes less than 2 seconds.     Coloration: Skin is not jaundiced.  Findings: No erythema or rash.  Neurological:     General: No focal deficit present.     Mental Status: He is alert and oriented to person, place, and time.  Psychiatric:        Mood and Affect: Mood normal.        Behavior: Behavior normal.        Thought Content: Thought content normal.        Judgment: Judgment normal.      UC Treatments / Results  Labs (all labs ordered are listed, but only abnormal results are displayed) Labs Reviewed  RESP PANEL BY RT-PCR (FLU A&B, COVID) ARPGX2 - Abnormal; Notable for the following components:      Result Value   SARS Coronavirus 2 by RT PCR POSITIVE (*)    All other components within normal limits  GLUCOSE, CAPILLARY - Abnormal; Notable for the following components:   Glucose-Capillary 117 (*)    All other components within normal limits  CBC WITH DIFFERENTIAL/PLATELET - Abnormal; Notable for the following components:   RBC 5.90 (*)    All other components within normal limits  COMPREHENSIVE METABOLIC PANEL - Abnormal; Notable for the following components:   Glucose, Bld 119 (*)    Creatinine, Ser 1.83 (*)    Calcium 8.6 (*)    Total Protein 8.3 (*)    AST 83 (*)    ALT 82 (*)    GFR, Estimated 47 (*)    All other components within normal limits  CBG MONITORING, ED    EKG   Radiology DG Chest 2 View  Result Date: 04/17/2020 CLINICAL DATA:  Fever, cough. EXAM: CHEST - 2 VIEW COMPARISON:  None. FINDINGS: The heart size and mediastinal contours are within normal limits. No pneumothorax or pleural effusion is noted. Minimal patchy opacities are noted in both lungs which may represent multifocal pneumonia. The visualized skeletal structures are unremarkable. IMPRESSION:  Minimal patchy opacities are noted in both lungs which may represent multifocal pneumonia. Electronically Signed   By: Lupita Raider M.D.   On: 04/17/2020 18:11    Procedures Procedures (including critical care time)  Medications Ordered in UC Medications  acetaminophen (TYLENOL) tablet 1,000 mg (1,000 mg Oral Given 04/17/20 1713)    Initial Impression / Assessment and Plan / UC Course  I have reviewed the triage vital signs and the nursing notes.  Pertinent labs & imaging results that were available during my care of the patient were reviewed by me and considered in my medical decision making (see chart for details).   Patient is here for evaluation of cough and weakness x4 days.  Patient is very ill-appearing and diaphoretic.  Patient is also tachypneic but lung sounds are clear to auscultation.  Nasal mucosa is mildly erythematous and edematous with clear postnasal drip.  Concern for flu, Covid, or pneumonia.  We will send [facility]plex respiratory panel, CBC, CMP, and obtain chest x-ray.  CBC shows normal white count.  Differential is pending.  CMP still pending.  Respiratory panel is Covid positive.  Chest x-ray shows diffuse patchy opacities in both lungs as well as cardiomegaly.  Patient remains tachycardic at 106 bpm and mildly hypoxic at 93%.  Patient's tachypnea has increased to 36 breaths/min.  Based on the bilateral Covid pneumonia, hypoxia, tachypnea.  With his comorbidities on medicine the patient to [facility] by EMS for evaluation and admission.  Report given to ER charge nurse Lelon Mast at [facility].  Patient was transported to [facility] via EMS. At discharge patient remained tachypneic with a respiratory rate of 28 and tachycardic with a rate of 103. Patient's oxygen saturation remained 93% on room air.  Final Clinical Impressions(s) / UC Diagnoses   Final diagnoses:  Pneumonia due to COVID-19 virus     Discharge Instructions      Please go to [facility] emergency department for further evaluation and possible admission for your Covid pneumonia.    ED Prescriptions    None     PDMP not reviewed this encounter.   Becky Augusta, NP 04/19/20 0745

## 2020-04-17 NOTE — Consult Note (Signed)
Remdesivir - Pharmacy Brief Note   A/P:  Per ED notes, patient tested positive for COVID today at clinic.   Remdesivir 200 mg IVPB once followed by 100 mg IVPB daily x 4 days.   Cephus Shelling, PharmD Clinical Pharmacist  04/17/2020 6:57 PM

## 2020-04-17 NOTE — ED Triage Notes (Signed)
Tested positive today at clinic.  CO SOB, fatigue and diaphoresis

## 2020-04-17 NOTE — Discharge Instructions (Addendum)
Please go to [facility] emergency department for further evaluation and possible admission for your Covid pneumonia.

## 2020-04-17 NOTE — ED Provider Notes (Signed)
[facility] Emergency Department Provider Note  ____________________________________________   First MD Initiated Contact with Patient 04/17/20 1846     (approximate)  I have reviewed the triage vital signs and the nursing notes.   HISTORY  Chief Complaint Covid Positive   HPI Derek Payne is a 39 y.o. male with past medical history of DM, HTN, and morbid obesity who presents via EMS from outpatient clinic after he was diagnosed with COVID-19 for further evaluation and management.  Patient states he has been feeling poorly for the last 2 days with myalgias, cough, fevers, very poor appetite and decreased p.o. intake as well as decreased urine output and diaphoresis.  He denies any headache, earache, sore throat, chest pain, Donnell pain, back pain, extremity pain, rash.  Denies any burning with urination.  States he had a little bit of nausea and vomiting but is not currently nauseous.  No diarrhea.  Denies any blood in his urine or stool.  He is not a smoker and denies EtOH or illicit drug use.  He has not been back in his COVID-19.         Past Medical History:  Diagnosis Date  . Diabetes mellitus without complication (HCC)   . Hypertension     Patient Active Problem List   Diagnosis Date Noted  . Acute hypoxemic respiratory failure due to COVID-19 [facility]) 04/17/2020    Past Surgical History:  Procedure Laterality Date  . NO PAST SURGERIES      Prior to Admission medications   Medication Sig Start Date End Date Taking? Authorizing Provider  amLODipine (NORVASC) 10 MG tablet TAKE 1 TABLET(10 MG) BY MOUTH DAILY 01/27/18   Everlene Other G, DO  hydrochlorothiazide (HYDRODIURIL) 25 MG tablet Take 1 tablet (25 mg total) by mouth daily. 06/17/18   Payton Mccallum, MD  metFORMIN (GLUCOPHAGE) 500 MG tablet Take 1 tablet (500 mg total) by mouth 2 (two) times daily with a meal. 06/17/18   Payton Mccallum, MD    Allergies Lisinopril  Family History    Problem Relation Age of Onset  . Hypertension Mother     Social History Social History   Tobacco Use  . Smoking status: Never Smoker  . Smokeless tobacco: Never Used  Vaping Use  . Vaping Use: Never used  Substance Use Topics  . Alcohol use: No  . Drug use: No    Review of Systems  Review of Systems  Constitutional: Positive for chills, diaphoresis, fever and malaise/fatigue.  HENT: Negative for sore throat.   Eyes: Negative for pain.  Respiratory: Positive for cough and shortness of breath. Negative for stridor.   Cardiovascular: Negative for chest pain.  Gastrointestinal: Negative for vomiting.  Genitourinary: Negative for flank pain.  Musculoskeletal: Positive for myalgias.  Skin: Negative for rash.  Neurological: Positive for dizziness. Negative for seizures, loss of consciousness and headaches.  Psychiatric/Behavioral: Negative for suicidal ideas.  All other systems reviewed and are negative.     ____________________________________________   PHYSICAL EXAM:  VITAL SIGNS: ED Triage Vitals  Enc Vitals Group     BP      Pulse      Resp      Temp      Temp src      SpO2      Weight      Height      Head Circumference      Peak Flow      Pain Score  Pain Loc      Pain Edu?      Excl. in GC?    Vitals:   04/17/20 2100 04/17/20 2130  BP: (!) 142/82 140/88  Pulse: 73 71  Resp: (!) 22 (!) 24  Temp:    SpO2: 91% 92%   Physical Exam Vitals and nursing note reviewed.  Constitutional:      Appearance: He is well-developed. He is obese. He is ill-appearing and diaphoretic.  HENT:     Head: Normocephalic and atraumatic.     Right Ear: External ear normal.     Left Ear: External ear normal.     Nose: Nose normal.     Mouth/Throat:     Mouth: Mucous membranes are dry.  Eyes:     Conjunctiva/sclera: Conjunctivae normal.  Cardiovascular:     Rate and Rhythm: Normal rate and regular rhythm.     Heart sounds: No murmur heard.   Pulmonary:      Effort: Pulmonary effort is normal. Tachypnea present. No respiratory distress.     Breath sounds: Normal breath sounds.  Abdominal:     Palpations: Abdomen is soft.     Tenderness: There is no abdominal tenderness.  Musculoskeletal:     Cervical back: Neck supple. No rigidity.  Skin:    General: Skin is warm.     Capillary Refill: Capillary refill takes 2 to 3 seconds.  Neurological:     Mental Status: He is alert and oriented to person, place, and time.  Psychiatric:        Mood and Affect: Mood normal.      ____________________________________________   LABS (all labs ordered are listed, but only abnormal results are displayed)  Labs Reviewed  BLOOD GAS, VENOUS - Abnormal; Notable for the following components:      Result Value   pH, Ven 7.45 (*)    pCO2, Ven 38 (*)    Acid-Base Excess 2.4 (*)    All other components within normal limits  TROPONIN I (HIGH SENSITIVITY) - Abnormal; Notable for the following components:   Troponin I (High Sensitivity) 55 (*)    All other components within normal limits  PROCALCITONIN  BRAIN NATRIURETIC PEPTIDE  MAGNESIUM  TROPONIN I (HIGH SENSITIVITY)   ____________________________________________  EKG  Sinus rhythm with ventricular to 78, normal axis, unremarkable intervals, and some nonspecific ST changes in lead III, aVF, and V6.  No other clear evidence of acute ischemia or other significant underlying arrhythmia ____________________________________________  RADIOLOGY  ED MD interpretation: Bilateral opacities consistent with a focal pneumonia.  No evidence of overt edema, large effusion, thorax or other acute intrathoracic process per  Official radiology report(s): DG Chest 2 View  Result Date: 04/17/2020 CLINICAL DATA:  Fever, cough. EXAM: CHEST - 2 VIEW COMPARISON:  None. FINDINGS: The heart size and mediastinal contours are within normal limits. No pneumothorax or pleural effusion is noted. Minimal patchy opacities are noted  in both lungs which may represent multifocal pneumonia. The visualized skeletal structures are unremarkable. IMPRESSION: Minimal patchy opacities are noted in both lungs which may represent multifocal pneumonia. Electronically Signed   By: Lupita Raider M.D.   On: 04/17/2020 18:11    ____________________________________________   PROCEDURES  Procedure(s) performed (including Critical Care):  .1-3 Lead EKG Interpretation Performed by: Gilles Chiquito, MD Authorized by: Gilles Chiquito, MD     Interpretation: normal     ECG rate assessment: normal     Rhythm: sinus rhythm  Ectopy: none     Conduction: normal       ____________________________________________   INITIAL IMPRESSION / ASSESSMENT AND PLAN / ED COURSE        Patient presents for further evaluation after referred from clinic for above-noted symptoms in the setting of being found Covid positive.  Patient is hypotensive will be of 86/40 and tachypneic with respiratory in the 20s with a sat of 90% on room air.  X-ray obtained in clinic shows evidence of multifocal pneumonia consistent with COVID-19 pneumonia.  Labs reviewed in clinic were reviewed.  CBC unremarkable as no evidence of acute anemia.  CMP with evidence of an AKI with a creatinine of 1.83 compared to 0.851-year ago as well as mild transaminitis but no other significant ultralight or metabolic derangements.  Blood gas shows no evidence of hypercarbic respiratory failure.  BNP is not elevated and overall a very low suspicion for acute heart failure or volume overload contributing to symptoms.  Magnesium is unremarkable.  Troponin is elevated at 55 I suspect this is related to some demand ischemia I would low suspicion for acute coronary thrombosis at this time.  Patient was given ASA we will plan to trend this.  Given severity of dehydration with AKI and elevated troponin all likely secondary to COVID-19 no plan to admit to medicine service for further  evaluation management.  Remdesivir, Decadron and IV fluids ordered while patient in the ED.  Patient also given ASA. ____________________________________________   FINAL CLINICAL IMPRESSION(S) / ED DIAGNOSES  Final diagnoses:  COVID-19  AKI (acute kidney injury) (HCC)  Dehydration  Troponin I above reference range    Medications  remdesivir 200 mg in sodium chloride 0.9% 250 mL IVPB (200 mg Intravenous New Bag/Given 04/17/20 2158)    Followed by  remdesivir 100 mg in sodium chloride 0.9 % 100 mL IVPB (has no administration in time range)  lactated ringers bolus 1,000 mL (1,000 mLs Intravenous New Bag/Given 04/17/20 2010)  dexamethasone (DECADRON) injection 6 mg (6 mg Intravenous Given 04/17/20 2153)  aspirin chewable tablet 324 mg (324 mg Oral Given 04/17/20 2152)     ED Discharge Orders    None       Note:  This document was prepared using Dragon voice recognition software and may include unintentional dictation errors.   Gilles Chiquito, MD 04/17/20 2240

## 2020-04-17 NOTE — ED Triage Notes (Signed)
Patient c/o cough and weakness that started 4 days ago. He is also thinks his BS is "off." states he hasn't had a chance to see hi PCP.

## 2020-04-18 DIAGNOSIS — J9601 Acute respiratory failure with hypoxia: Secondary | ICD-10-CM

## 2020-04-18 DIAGNOSIS — U071 COVID-19: Principal | ICD-10-CM

## 2020-04-18 LAB — CBC WITH DIFFERENTIAL/PLATELET
Abs Immature Granulocytes: 0.03 10*3/uL (ref 0.00–0.07)
Basophils Absolute: 0 10*3/uL (ref 0.0–0.1)
Basophils Relative: 0 %
Eosinophils Absolute: 0 10*3/uL (ref 0.0–0.5)
Eosinophils Relative: 0 %
HCT: 47.3 % (ref 39.0–52.0)
Hemoglobin: 15.2 g/dL (ref 13.0–17.0)
Immature Granulocytes: 1 %
Lymphocytes Relative: 21 %
Lymphs Abs: 1 10*3/uL (ref 0.7–4.0)
MCH: 26.9 pg (ref 26.0–34.0)
MCHC: 32.1 g/dL (ref 30.0–36.0)
MCV: 83.7 fL (ref 80.0–100.0)
Monocytes Absolute: 0.4 10*3/uL (ref 0.1–1.0)
Monocytes Relative: 8 %
Neutro Abs: 3.4 10*3/uL (ref 1.7–7.7)
Neutrophils Relative %: 70 %
Platelets: 194 10*3/uL (ref 150–400)
RBC: 5.65 MIL/uL (ref 4.22–5.81)
RDW: 14.5 % (ref 11.5–15.5)
Smear Review: NORMAL
WBC: 4.8 10*3/uL (ref 4.0–10.5)
nRBC: 0 % (ref 0.0–0.2)

## 2020-04-18 LAB — FIBRIN DERIVATIVES D-DIMER (ARMC ONLY): Fibrin derivatives D-dimer (ARMC): 769.33 ng/mL (FEU) — ABNORMAL HIGH (ref 0.00–499.00)

## 2020-04-18 LAB — COMPREHENSIVE METABOLIC PANEL
ALT: 77 U/L — ABNORMAL HIGH (ref 0–44)
AST: 72 U/L — ABNORMAL HIGH (ref 15–41)
Albumin: 3.3 g/dL — ABNORMAL LOW (ref 3.5–5.0)
Alkaline Phosphatase: 47 U/L (ref 38–126)
Anion gap: 12 (ref 5–15)
BUN: 14 mg/dL (ref 6–20)
CO2: 24 mmol/L (ref 22–32)
Calcium: 8.5 mg/dL — ABNORMAL LOW (ref 8.9–10.3)
Chloride: 102 mmol/L (ref 98–111)
Creatinine, Ser: 1.47 mg/dL — ABNORMAL HIGH (ref 0.61–1.24)
GFR, Estimated: >60 mL/min (ref >60)
Glucose, Bld: 124 mg/dL — ABNORMAL HIGH (ref 70–99)
Potassium: 3.9 mmol/L (ref 3.5–5.1)
Sodium: 138 mmol/L (ref 135–145)
Total Bilirubin: 1 mg/dL (ref 0.3–1.2)
Total Protein: 7.7 g/dL (ref 6.5–8.1)

## 2020-04-18 LAB — TROPONIN I (HIGH SENSITIVITY): Troponin I (High Sensitivity): 32 ng/L — ABNORMAL HIGH (ref <18)

## 2020-04-18 LAB — HIV ANTIBODY (ROUTINE TESTING W REFLEX): HIV Screen 4th Generation wRfx: NONREACTIVE

## 2020-04-18 LAB — CBG MONITORING, ED
Glucose-Capillary: 108 mg/dL — ABNORMAL HIGH (ref 70–99)
Glucose-Capillary: 123 mg/dL — ABNORMAL HIGH (ref 70–99)

## 2020-04-18 LAB — C-REACTIVE PROTEIN: CRP: 7.4 mg/dL — ABNORMAL HIGH (ref <1.0)

## 2020-04-18 LAB — HEMOGLOBIN A1C
Hgb A1c MFr Bld: 5.7 % — ABNORMAL HIGH (ref 4.8–5.6)
Mean Plasma Glucose: 116.89 mg/dL

## 2020-04-18 MED ORDER — ENOXAPARIN SODIUM 100 MG/ML ~~LOC~~ SOLN
100.0000 mg | SUBCUTANEOUS | Status: DC
  Administered 2020-04-18: 100 mg via SUBCUTANEOUS
  Filled 2020-04-18: qty 1

## 2020-04-18 MED ORDER — ONDANSETRON HCL 4 MG/2ML IJ SOLN: 4.0000 mg | Freq: Four times a day (QID) | INTRAMUSCULAR | Status: DC | PRN

## 2020-04-18 MED ORDER — PREDNISONE 20 MG PO TABS: 50.0000 mg | ORAL_TABLET | Freq: Every day | ORAL | Status: DC

## 2020-04-18 MED ORDER — ZINC SULFATE 220 (50 ZN) MG PO CAPS
220.0000 mg | ORAL_CAPSULE | Freq: Every day | ORAL | Status: DC
  Administered 2020-04-18: 220 mg via ORAL
  Filled 2020-04-18: qty 1

## 2020-04-18 MED ORDER — PREDNISONE 50 MG PO TABS: 50.0000 mg | ORAL_TABLET | Freq: Every day | ORAL | 0 refills | Status: AC

## 2020-04-18 MED ORDER — METHYLPREDNISOLONE SODIUM SUCC 125 MG IJ SOLR
0.5000 mg/kg | Freq: Two times a day (BID) | INTRAMUSCULAR | Status: DC
  Administered 2020-04-18: 100 mg via INTRAVENOUS
  Filled 2020-04-18: qty 2

## 2020-04-18 MED ORDER — ONDANSETRON HCL 4 MG PO TABS: 4.0000 mg | ORAL_TABLET | Freq: Four times a day (QID) | ORAL | Status: DC | PRN

## 2020-04-18 MED ORDER — ALBUTEROL SULFATE HFA 108 (90 BASE) MCG/ACT IN AERS: 2.0000 | INHALATION_SPRAY | Freq: Four times a day (QID) | RESPIRATORY_TRACT | 0 refills | Status: AC | PRN

## 2020-04-18 MED ORDER — BENZONATATE 100 MG PO CAPS: 100.0000 mg | ORAL_CAPSULE | Freq: Three times a day (TID) | ORAL | 0 refills | Status: AC | PRN

## 2020-04-18 MED ORDER — ACETAMINOPHEN 325 MG PO TABS: 650.0000 mg | ORAL_TABLET | Freq: Four times a day (QID) | ORAL | Status: DC | PRN

## 2020-04-18 MED ORDER — INSULIN ASPART 100 UNIT/ML ~~LOC~~ SOLN: 0.0000 [IU] | Freq: Every day | SUBCUTANEOUS | Status: DC

## 2020-04-18 MED ORDER — ALBUTEROL SULFATE HFA 108 (90 BASE) MCG/ACT IN AERS
2.0000 | INHALATION_SPRAY | Freq: Four times a day (QID) | RESPIRATORY_TRACT | Status: DC
  Administered 2020-04-18: 2 via RESPIRATORY_TRACT
  Filled 2020-04-18: qty 6.7

## 2020-04-18 MED ORDER — SODIUM CHLORIDE 0.9 % IV SOLN: INTRAVENOUS | Status: DC

## 2020-04-18 MED ORDER — ASCORBIC ACID 500 MG PO TABS
500.0000 mg | ORAL_TABLET | Freq: Every day | ORAL | Status: DC
  Administered 2020-04-18: 500 mg via ORAL
  Filled 2020-04-18: qty 1

## 2020-04-18 MED ORDER — GUAIFENESIN-DM 100-10 MG/5ML PO SYRP: 10.0000 mL | ORAL_SOLUTION | ORAL | 0 refills | Status: AC | PRN

## 2020-04-18 MED ORDER — HYDROCOD POLST-CPM POLST ER 10-8 MG/5ML PO SUER: 5.0000 mL | Freq: Two times a day (BID) | ORAL | Status: DC | PRN

## 2020-04-18 MED ORDER — ACETAMINOPHEN 325 MG PO TABS: 650.0000 mg | ORAL_TABLET | Freq: Four times a day (QID) | ORAL | Status: AC | PRN

## 2020-04-18 MED ORDER — GUAIFENESIN-DM 100-10 MG/5ML PO SYRP: 10.0000 mL | ORAL_SOLUTION | ORAL | Status: DC | PRN

## 2020-04-18 MED ORDER — INSULIN ASPART 100 UNIT/ML ~~LOC~~ SOLN
0.0000 [IU] | Freq: Three times a day (TID) | SUBCUTANEOUS | Status: DC
  Administered 2020-04-18: 3 [IU] via SUBCUTANEOUS
  Filled 2020-04-18 (×2): qty 1

## 2020-04-18 NOTE — ED Notes (Signed)
Pt ambulatory to the toilet in the room independently; mild dip in O2 sats to 89% very briefly then above 94%.

## 2020-04-18 NOTE — Discharge Summary (Signed)
Physician Discharge Summary  Fabian Walder Cullinan KPT:[id number] DOB: [birth year] DOA: 04/17/2020  [facility]P: [facility]  Admit date: 04/17/2020 Discharge date: 04/18/2020  Admitted From: Home Disposition: Home  Recommendations for Outpatient Follow-up:  1. Follow up with PCP in 1-2 weeks 2.   Home Health: No Equipment/Devices: None  discharge Condition: Stable CODE STATUS: Full Diet recommendation: Heart Healthy / Carb Modified  Brief/Interim Summary: 39 y.o. male with medical history significant of hypertension, type 2 diabetes mellitus, morbid obesity, comes to the hospital with complaints of generalized weakness, shortness of breath.  He has been having a cough and has been feeling fatigued, weak for about 4 days.  He reports that his fiance is also sick with similar symptoms.  He reports fever and chills, went to urgent care today and was found to have COVID-19.  He was also found to be hypoxic and directed to the emergency room.  He is not vaccinated.  He also reports very poor appetite and decreased p.o. intake as well as decreased urine output.  He denies any loss of taste or smell.  No abdominal pain, no nausea or vomiting.  No diarrhea.  12/7: Patient received 2 doses of IV remdesivir and 2 doses of intravenous steroids.  He was weaned off supplemental oxygen and remained so for entirety of the morning.  This afternoon I received a call from bedside RN stating the patient was feeling better and requesting if he could leave.  I went back to evaluate and discussed with the patient at bedside.  I explained that given his comorbidities and body habitus he is high risk for decompensation but at this time he has no evidence of very severe disease.  I stated that if he was to go home he should pay close attention to his symptoms and monitor temperature and pulse oximetry closely and seek medical attention should he start to feel bad.  He agreed to do so.  At this time we will discharge  patient home.  Clear return to ED instructions have been provided.  Will prescribe remainder of steroid course to complete total 10 days.  As needed albuterol MDI and Tessalon Perles also prescribed.  Remainder of home regimen unchanged.  Discharge Diagnoses:  Active Problems:   Acute hypoxemic respiratory failure due to COVID-19 [facility])  Acute hypoxic respiratory failure due to COVID-19 pneumonia Patient with minimal oxygen needs.  This was weaned off after 2 doses of remdesivir and 2 doses of IV steroid.  Patient was sitting up in a chair and speaking in complete sentences without dyspnea.  He is afebrile.  We will discharge home at this time.  Clear return to ED instructions have been provided.  Remainder of steroid course has been prescribed in addition to as needed albuterol MDI and Tessalon Perles.  Active Problems Acute kidney injury -Likely in the setting of poor p.o. intake and taking home diuretics Metformin hydrochlorothiazide were held.  Patient was instructed to ensure adequate p.o. fluid intake.  He agreed to do so.  Follow-up PCP 1 week.  Essential hypertension Resume home blood pressure regimen on discharge  Type 2 diabetes mellitus Resume home regimen on discharge  Morbid obesity -Based on BMI of 66, patient will benefit from weight loss  Discharge Instructions  Discharge Instructions    Diet - low sodium heart healthy   Complete by: As directed    Increase activity slowly   Complete by: As directed      Allergies as of 04/18/2020  Reactions   Lisinopril Swelling, Hives      Medication List    TAKE these medications   acetaminophen 325 MG tablet Commonly known as: TYLENOL Take 2 tablets (650 mg total) by mouth every 6 (six) hours as needed for mild pain or headache (fever >/= 101).   albuterol 108 (90 Base) MCG/ACT inhaler Commonly known as: VENTOLIN HFA Inhale 2 puffs into the lungs every 6 (six) hours as needed for wheezing or shortness of  breath.   amLODipine 10 MG tablet Commonly known as: NORVASC TAKE 1 TABLET(10 MG) BY MOUTH DAILY What changed: See the new instructions.   benzonatate 100 MG capsule Commonly known as: Tessalon Perles Take 1 capsule (100 mg total) by mouth 3 (three) times daily as needed for cough.   guaiFENesin-dextromethorphan 100-10 MG/5ML syrup Commonly known as: ROBITUSSIN DM Take 10 mLs by mouth every 4 (four) hours as needed for cough.   hydrALAZINE 50 MG tablet Commonly known as: APRESOLINE Take 1 tablet by mouth in the morning and at bedtime.   hydrochlorothiazide 25 MG tablet Commonly known as: HYDRODIURIL Take 1 tablet (25 mg total) by mouth daily.   metFORMIN 500 MG tablet Commonly known as: GLUCOPHAGE Take 1 tablet (500 mg total) by mouth 2 (two) times daily with a meal.   predniSONE 50 MG tablet Commonly known as: DELTASONE Take 1 tablet (50 mg total) by mouth daily for 9 days. Start taking on: April 21, 2020       Follow-up Information    [place]. Schedule an appointment as soon as possible for a visit in 1 week(s).   Contact information: [address] [place] [address] [place] [address] [phone]              Allergies  Allergen Reactions  . Lisinopril Swelling and Hives    Consultations:  None   Procedures/Studies: DG Chest 2 View  Result Date: 04/17/2020 CLINICAL DATA:  Fever, cough. EXAM: CHEST - 2 VIEW COMPARISON:  None. FINDINGS: The heart size and mediastinal contours are within normal limits. No pneumothorax or pleural effusion is noted. Minimal patchy opacities are noted in both lungs which may represent multifocal pneumonia. The visualized skeletal structures are unremarkable. IMPRESSION: Minimal patchy opacities are noted in both lungs which may represent multifocal pneumonia. Electronically Signed   By: Lupita Raider M.D.   On: 04/17/2020 18:11    (Echo, Carotid, EGD, Colonoscopy, ERCP)    Subjective: Seen and examined on the  day of discharge.  Stable, no distress.  Weaned off supplemental oxygen.  Stable for discharge home  Discharge Exam: Vitals:   04/18/20 1200 04/18/20 1403  BP: (!) 149/85 138/77  Pulse: 79 80  Resp:  16  Temp:  98.1 F (36.7 C)  SpO2: 94% 96%   Vitals:   04/18/20 1000 04/18/20 1100 04/18/20 1200 04/18/20 1403  BP: (!) 161/89 (!) 156/85 (!) 149/85 138/77  Pulse: 78 75 79 80  Resp:  (!) 22  16  Temp:    98.1 F (36.7 C)  TempSrc:    Oral  SpO2: 93% 94% 94% 96%  Weight:      Height:        General: Pt is alert, awake, not in acute distress Cardiovascular: RRR, S1/S2 +, no rubs, no gallops Respiratory: Mild bilateral crackles.  Normal work of breathing.  Room air Abdominal: Obese, soft, nontender, nondistended, positive bowel Extremities: no edema, no cyanosis    The results of significant diagnostics from this  hospitalization (including imaging, microbiology, ancillary and laboratory) are listed below for reference.     Microbiology: Recent Results (from the past 240 hour(s))  Resp Panel by RT-PCR (Flu A&B, Covid) Nasopharyngeal Swab     Status: Abnormal   Collection Time: 04/17/20  5:14 PM   Specimen: Nasopharyngeal Swab; Nasopharyngeal(NP) swabs in vial transport medium  Result Value Ref Range Status   SARS Coronavirus 2 by RT PCR POSITIVE (A) NEGATIVE Final    Comment: RESULT CALLED TO, READ BACK BY AND VERIFIED WITH: JEREMY RYAN/1755PM/Apr 17 2020/SFW (NOTE) SARS-CoV-2 target nucleic acids are DETECTED.  The SARS-CoV-2 RNA is generally detectable in upper respiratory specimens during the acute phase of infection. Positive results are indicative of the presence of the identified virus, but do not rule out bacterial infection or co-infection with other pathogens not detected by the test. Clinical correlation with patient history and other diagnostic information is necessary to determine patient infection status. The expected result is Negative.  Fact Sheet for  Patients: BloggerCourse.com  Fact Sheet for Healthcare Providers: SeriousBroker.it  This test is not yet approved or cleared by the [place] FDA and  has been authorized for detection and/or diagnosis of SARS-CoV-2 by FDA under an Emergency Use Authorization (EUA).  This EUA will remain in effect (meaning this test can be  used) for the duration of  the COVID-19 declaration under Section 564(b)(1) of the Act, 21 U.S.C. section 360bbb-3(b)(1), unless the authorization is terminated or revoked sooner.     Influenza A by PCR NEGATIVE NEGATIVE Final   Influenza B by PCR NEGATIVE NEGATIVE Final    Comment: (NOTE) The Xpert Xpress SARS-CoV-2/FLU/RSV plus assay is intended as an aid in the diagnosis of influenza from Nasopharyngeal swab specimens and should not be used as a sole basis for treatment. Nasal washings and aspirates are unacceptable for Xpert Xpress SARS-CoV-2/FLU/RSV testing.  Fact Sheet for Patients: BloggerCourse.com  Fact Sheet for Healthcare Providers: SeriousBroker.it  This test is not yet approved or cleared by the [place] FDA and has been authorized for detection and/or diagnosis of SARS-CoV-2 by FDA under an Emergency Use Authorization (EUA). This EUA will remain in effect (meaning this test can be used) for the duration of the COVID-19 declaration under Section 564(b)(1) of the Act, 21 U.S.C. section 360bbb-3(b)(1), unless the authorization is terminated or revoked.  Performed at [facility], [address] [place], [place] [address]      Labs: BNP (last 3 results) Recent Labs    04/17/20 1848  BNP 19.1   Basic Metabolic Panel: Recent Labs  Lab 04/17/20 1737 04/17/20 1848 04/18/20 0447  NA 135  --  138  K 3.5  --  3.9  CL 98  --  102  CO2 22  --  24  GLUCOSE 119*  --  124*  BUN 13  --  14  CREATININE 1.83*  --  1.47*   CALCIUM 8.6*  --  8.5*  MG  --  2.0  --    Liver Function Tests: Recent Labs  Lab 04/17/20 1737 04/18/20 0447  AST 83* 72*  ALT 82* 77*  ALKPHOS 49 47  BILITOT 0.7 1.0  PROT 8.3* 7.7  ALBUMIN 3.6 3.3*   No results for input(s): LIPASE, AMYLASE in the last 168 hours. No results for input(s): AMMONIA in the last 168 hours. CBC: Recent Labs  Lab 04/17/20 1737 04/18/20 0447  WBC 5.3 4.8  NEUTROABS 2.9 3.4  HGB 15.9 15.2  HCT  48.2 47.3  MCV 81.7 83.7  PLT 227 194   Cardiac Enzymes: No results for input(s): CKTOTAL, CKMB, CKMBINDEX, TROPONINI in the last 168 hours. BNP: Invalid input(s): POCBNP CBG: Recent Labs  Lab 04/17/20 1711 04/18/20 0854 04/18/20 1147  GLUCAP 117* 108* 123*   D-Dimer No results for input(s): DDIMER in the last 72 hours. Hgb A1c Recent Labs    04/18/20 0447  HGBA1C 5.7*   Lipid Profile No results for input(s): CHOL, HDL, LDLCALC, TRIG, CHOLHDL, LDLDIRECT in the last 72 hours. Thyroid function studies No results for input(s): TSH, T4TOTAL, T3FREE, THYROIDAB in the last 72 hours.  Invalid input(s): FREET3 Anemia work up No results for input(s): VITAMINB12, FOLATE, FERRITIN, TIBC, IRON, RETICCTPCT in the last 72 hours. Urinalysis No results found for: COLORURINE, APPEARANCEUR, LABSPEC, PHURINE, GLUCOSEU, HGBUR, BILIRUBINUR, KETONESUR, PROTEINUR, UROBILINOGEN, NITRITE, LEUKOCYTESUR Sepsis Labs Invalid input(s): PROCALCITONIN,  WBC,  LACTICIDVEN Microbiology Recent Results (from the past 240 hour(s))  Resp Panel by RT-PCR (Flu A&B, Covid) Nasopharyngeal Swab     Status: Abnormal   Collection Time: 04/17/20  5:14 PM   Specimen: Nasopharyngeal Swab; Nasopharyngeal(NP) swabs in vial transport medium  Result Value Ref Range Status   SARS Coronavirus 2 by RT PCR POSITIVE (A) NEGATIVE Final    Comment: RESULT CALLED TO, READ BACK BY AND VERIFIED WITH: JEREMY RYAN/1755PM/Apr 17 2020/SFW (NOTE) SARS-CoV-2 target nucleic acids are  DETECTED.  The SARS-CoV-2 RNA is generally detectable in upper respiratory specimens during the acute phase of infection. Positive results are indicative of the presence of the identified virus, but do not rule out bacterial infection or co-infection with other pathogens not detected by the test. Clinical correlation with patient history and other diagnostic information is necessary to determine patient infection status. The expected result is Negative.  Fact Sheet for Patients: BloggerCourse.comhttps://www.fda.gov/media/152166/download  Fact Sheet for Healthcare Providers: SeriousBroker.ithttps://www.fda.gov/media/152162/download  This test is not yet approved or cleared by the [place] FDA and  has been authorized for detection and/or diagnosis of SARS-CoV-2 by FDA under an Emergency Use Authorization (EUA).  This EUA will remain in effect (meaning this test can be  used) for the duration of  the COVID-19 declaration under Section 564(b)(1) of the Act, 21 U.S.C. section 360bbb-3(b)(1), unless the authorization is terminated or revoked sooner.     Influenza A by PCR NEGATIVE NEGATIVE Final   Influenza B by PCR NEGATIVE NEGATIVE Final    Comment: (NOTE) The Xpert Xpress SARS-CoV-2/FLU/RSV plus assay is intended as an aid in the diagnosis of influenza from Nasopharyngeal swab specimens and should not be used as a sole basis for treatment. Nasal washings and aspirates are unacceptable for Xpert Xpress SARS-CoV-2/FLU/RSV testing.  Fact Sheet for Patients: BloggerCourse.comhttps://www.fda.gov/media/152166/download  Fact Sheet for Healthcare Providers: SeriousBroker.ithttps://www.fda.gov/media/152162/download  This test is not yet approved or cleared by the [place] FDA and has been authorized for detection and/or diagnosis of SARS-CoV-2 by FDA under an Emergency Use Authorization (EUA). This EUA will remain in effect (meaning this test can be used) for the duration of the COVID-19 declaration under Section 564(b)(1) of the Act, 21  U.S.C. section 360bbb-3(b)(1), unless the authorization is terminated or revoked.  Performed [facility], [address] [place], [place] [id number]      Time coordinating discharge: Over 30 minutes  SIGNED:   Tresa MooreSudheer B Silver Parkey, MD  [facility] 04/18/2020, 3:19 PM Pager   If 7PM-7AM, please contact night-coverage

## 2020-04-18 NOTE — Discharge Instructions (Signed)
10 Things You Can Do to Manage Your COVID-19 Symptoms at Home If you have possible or confirmed COVID-19: 1. Stay home from work and school. And stay away from other public places. If you must go out, avoid using any kind of public transportation, ridesharing, or taxis. 2. Monitor your symptoms carefully. If your symptoms get worse, call your healthcare provider immediately. 3. Get rest and stay hydrated. 4. If you have a medical appointment, call the healthcare provider ahead of time and tell them that you have or may have COVID-19. 5. For medical emergencies, call 911 and notify the dispatch personnel that you have or may have COVID-19. 6. Cover your cough and sneezes with a tissue or use the inside of your elbow. 7. Wash your hands often with soap and water for at least 20 seconds or clean your hands with an alcohol-based hand sanitizer that contains at least 60% alcohol. 8. As much as possible, stay in a specific room and away from other people in your home. Also, you should use a separate bathroom, if available. If you need to be around other people in or outside of the home, wear a mask. 9. Avoid sharing personal items with other people in your household, like dishes, towels, and bedding. 10. Clean all surfaces that are touched often, like counters, tabletops, and doorknobs. Use household cleaning sprays or wipes according to the label instructions. cdc.gov/coronavirus 11/11/2018 This information is not intended to replace advice given to you by your health care provider. Make sure you discuss any questions you have with your health care provider. Document Revised: 04/15/2019 Document Reviewed: 04/15/2019 Elsevier Patient Education  2020 [employer]   COVID-19 Frequently Asked Questions COVID-19 (coronavirus disease) is an infection that is caused by a large family of viruses. Some viruses cause illness in people and others cause illness in animals like camels, cats, and bats. In some  cases, the viruses that cause illness in animals can spread to humans. Where did the coronavirus come from? In December 2019, [place] told the World Health Organization (WHO) of several cases of lung disease (human respiratory illness). These cases were linked to an open seafood and livestock market in the city of [place]. The link to the seafood and livestock market suggests that the virus may have spread from animals to humans. However, since that first outbreak in December, the virus has also been shown to spread from person to person. What is the name of the disease and the virus? Disease name Early on, this disease was called novel coronavirus. This is because scientists determined that the disease was caused by a new (novel) respiratory virus. The World Health Organization (WHO) has now named the disease COVID-19, or coronavirus disease. Virus name The virus that causes the disease is called severe acute respiratory syndrome coronavirus 2 (SARS-CoV-2). More information on disease and virus naming World Health Organization (WHO): www.who.int/emergencies/diseases/novel-coronavirus-2019/technical-guidance/naming-the-coronavirus-disease-(covid-2019)-and-the-virus-that-causes-it Who is at risk for complications from coronavirus disease? Some people may be at higher risk for complications from coronavirus disease. This includes older adults and people who have chronic diseases, such as heart disease, diabetes, and lung disease. If you are at higher risk for complications, take these extra precautions:  Stay home as much as possible.  Avoid social gatherings and travel.  Avoid close contact with others. Stay at least 6 ft (2 m) away from others, if possible.  Wash your hands often with soap and water for at least 20 seconds.  Avoid touching your face, mouth, nose, or eyes.    Keep supplies on hand at home, such as food, medicine, and cleaning supplies.  If you must go out in public, wear a cloth  face covering or face mask. Make sure your mask covers your nose and mouth. How does coronavirus disease spread? The virus that causes coronavirus disease spreads easily from person to person (is contagious). You may catch the virus by:  Breathing in droplets from an infected person. Droplets can be spread by a person breathing, speaking, singing, coughing, or sneezing.  Touching something, like a table or a doorknob, that was exposed to the virus (contaminated) and then touching your mouth, nose, or eyes. Can I get the virus from touching surfaces or objects? There is still a lot that we do not know about the virus that causes coronavirus disease. Scientists are basing a lot of information on what they know about similar viruses, such as:  Viruses cannot generally survive on surfaces for long. They need a human body (host) to survive.  It is more likely that the virus is spread by close contact with people who are sick (direct contact), such as through: ? Shaking hands or hugging. ? Breathing in respiratory droplets that travel through the air. Droplets can be spread by a person breathing, speaking, singing, coughing, or sneezing.  It is less likely that the virus is spread when a person touches a surface or object that has the virus on it (indirect contact). The virus may be able to enter the body if the person touches a surface or object and then touches his or her face, eyes, nose, or mouth. Can a person spread the virus without having symptoms of the disease? It may be possible for the virus to spread before a person has symptoms of the disease, but this is most likely not the main way the virus is spreading. It is more likely for the virus to spread by being in close contact with people who are sick and breathing in the respiratory droplets spread by a person breathing, speaking, singing, coughing, or sneezing. What are the symptoms of coronavirus disease? Symptoms vary from person to  person and can range from mild to severe. Symptoms may include:  Fever or chills.  Cough.  Difficulty breathing or feeling short of breath.  Headaches, body aches, or muscle aches.  Runny or stuffy (congested) nose.  Sore throat.  New loss of taste or smell.  Nausea, vomiting, or diarrhea. These symptoms can appear anywhere from 2 to 14 days after you have been exposed to the virus. Some people may not have any symptoms. If you develop symptoms, call your health care provider. People with severe symptoms may need hospital care. Should I be tested for this virus? Your health care provider will decide whether to test you based on your symptoms, history of exposure, and your risk factors. How does a health care provider test for this virus? Health care providers will collect samples to send for testing. Samples may include:  Taking a swab of fluid from the back of your nose and throat, your nose, or your throat.  Taking fluid from the lungs by having you cough up mucus (sputum) into a sterile cup.  Taking a blood sample. Is there a treatment or vaccine for this virus? Currently, there is no vaccine to prevent coronavirus disease. Also, there are no medicines like antibiotics or antivirals to treat the virus. A person who becomes sick is given supportive care, which means rest and fluids. A person may also   relieve his or her symptoms by using over-the-counter medicines that treat sneezing, coughing, and runny nose. These are the same medicines that a person takes for the common cold. If you develop symptoms, call your health care provider. People with severe symptoms may need hospital care. What can I do to protect myself and my family from this virus?     You can protect yourself and your family by taking the same actions that you would take to prevent the spread of other viruses. Take the following actions:  Wash your hands often with soap and water for at least 20 seconds. If soap  and water are not available, use alcohol-based hand sanitizer.  Avoid touching your face, mouth, nose, or eyes.  Cough or sneeze into a tissue, sleeve, or elbow. Do not cough or sneeze into your hand or the air. ? If you cough or sneeze into a tissue, throw it away immediately and wash your hands.  Disinfect objects and surfaces that you frequently touch every day.  Stay away from people who are sick.  Avoid going out in public, follow guidance from your state and local health authorities.  Avoid crowded indoor spaces. Stay at least 6 ft (2 m) away from others.  If you must go out in public, wear a cloth face covering or face mask. Make sure your mask covers your nose and mouth.  Stay home if you are sick, except to get medical care. Call your health care provider before you get medical care. Your health care provider will tell you how long to stay home.  Make sure your vaccines are up to date. Ask your health care provider what vaccines you need. What should I do if I need to travel? Follow travel recommendations from your local health authority, the CDC, and WHO. Travel information and advice  Centers for Disease Control and Prevention (CDC): www.cdc.gov/coronavirus/2019-ncov/travelers/index.html  World Health Organization (WHO): www.who.int/emergencies/diseases/novel-coronavirus-2019/travel-advice Know the risks and take action to protect your health  You are at higher risk of getting coronavirus disease if you are traveling to areas with an outbreak or if you are exposed to travelers from areas with an outbreak.  Wash your hands often and practice good hygiene to lower the risk of catching or spreading the virus. What should I do if I am sick? General instructions to stop the spread of infection  Wash your hands often with soap and water for at least 20 seconds. If soap and water are not available, use alcohol-based hand sanitizer.  Cough or sneeze into a tissue, sleeve, or  elbow. Do not cough or sneeze into your hand or the air.  If you cough or sneeze into a tissue, throw it away immediately and wash your hands.  Stay home unless you must get medical care. Call your health care provider or local health authority before you get medical care.  Avoid public areas. Do not take public transportation, if possible.  If you can, wear a mask if you must go out of the house or if you are in close contact with someone who is not sick. Make sure your mask covers your nose and mouth. Keep your home clean  Disinfect objects and surfaces that are frequently touched every day. This may include: ? Counters and tables. ? Doorknobs and light switches. ? Sinks and faucets. ? Electronics such as phones, remote controls, keyboards, computers, and tablets.  Wash dishes in hot, soapy water or use a dishwasher. Air-dry your dishes.  Wash laundry in   hot water. Prevent infecting other household members  Let healthy household members care for children and pets, if possible. If you have to care for children or pets, wash your hands often and wear a mask.  Sleep in a different bedroom or bed, if possible.  Do not share personal items, such as razors, toothbrushes, deodorant, combs, brushes, towels, and washcloths. Where to find more information Centers for Disease Control and Prevention (CDC)  Information and news updates: https://www.butler-gonzalez.com/ World Health Organization [facility])  Information and news updates: MissExecutive.com.ee  Coronavirus health topic: https://www.castaneda.info/  Questions and answers on COVID-19: OpportunityDebt.at  Global tracker: who.sprinklr.com American Academy of Pediatrics (AAP)  Information for families: www.healthychildren.org/English/health-issues/conditions/chest-lungs/Pages/2019-Novel-Coronavirus.aspx The coronavirus situation is changing rapidly. Check  your local health authority website or the CDC and [facility] websites for updates and news. When should I contact a health care provider?  Contact your health care provider if you have symptoms of an infection, such as fever or cough, and you: ? Have been near anyone who is known to have coronavirus disease. ? Have come into contact with a person who is suspected to have coronavirus disease. ? Have traveled to an area where there is an outbreak of COVID-19. When should I get emergency medical care?  Get help right away by calling your local emergency services (911 in the [place] if you have: ? Trouble breathing. ? Pain or pressure in your chest. ? Confusion. ? Blue-tinged lips and fingernails. ? Difficulty waking from sleep. ? Symptoms that get worse. Let the emergency medical personnel know if you think you have coronavirus disease. Summary  A new respiratory virus is spreading from person to person and causing COVID-19 (coronavirus disease).  The virus that causes COVID-19 appears to spread easily. It spreads from one person to another through droplets from breathing, speaking, singing, coughing, or sneezing.  Older adults and those with chronic diseases are at higher risk of disease. If you are at higher risk for complications, take extra precautions.  There is currently no vaccine to prevent coronavirus disease. There are no medicines, such as antibiotics or antivirals, to treat the virus.  You can protect yourself and your family by washing your hands often, avoiding touching your face, and covering your coughs and sneezes. This information is not intended to replace advice given to you by your health care provider. Make sure you discuss any questions you have with your health care provider. Document Revised: 02/26/2019 Document Reviewed: 08/25/2018 Elsevier Patient Education  2020 [place].   COVID-19 COVID-19 is a respiratory infection that is caused by a virus called severe  acute respiratory syndrome coronavirus 2 (SARS-CoV-2). The disease is also known as coronavirus disease or novel coronavirus. In some people, the virus may not cause any symptoms. In others, it may cause a serious infection. The infection can get worse quickly and can lead to complications, such as:  Pneumonia, or infection of the lungs.  Acute respiratory distress syndrome or ARDS. This is a condition in which fluid build-up in the lungs prevents the lungs from filling with air and passing oxygen into the blood.  Acute respiratory failure. This is a condition in which there is not enough oxygen passing from the lungs to the body or when carbon dioxide is not passing from the lungs out of the body.  Sepsis or septic shock. This is a serious bodily reaction to an infection.  Blood clotting problems.  Secondary infections due to bacteria or fungus.  Organ failure. This is when your  body's organs stop working. The virus that causes COVID-19 is contagious. This means that it can spread from person to person through droplets from coughs and sneezes (respiratory secretions). What are the causes? This illness is caused by a virus. You may catch the virus by:  Breathing in droplets from an infected person. Droplets can be spread by a person breathing, speaking, singing, coughing, or sneezing.  Touching something, like a table or a doorknob, that was exposed to the virus (contaminated) and then touching your mouth, nose, or eyes. What increases the risk? Risk for infection You are more likely to be infected with this virus if you:  Are within 6 feet (2 meters) of a person with COVID-19.  Provide care for or live with a person who is infected with COVID-19.  Spend time in crowded indoor spaces or live in shared housing. Risk for serious illness You are more likely to become seriously ill from the virus if you:  Are 75 years of age or older. The higher your age, the more you are at risk for  serious illness.  Live in a nursing home or long-term care facility.  Have cancer.  Have a long-term (chronic) disease such as: ? Chronic lung disease, including chronic obstructive pulmonary disease or asthma. ? A long-term disease that lowers your body's ability to fight infection (immunocompromised). ? Heart disease, including heart failure, a condition in which the arteries that lead to the heart become narrow or blocked (coronary artery disease), a disease which makes the heart muscle thick, weak, or stiff (cardiomyopathy). ? Diabetes. ? Chronic kidney disease. ? Sickle cell disease, a condition in which red blood cells have an abnormal "sickle" shape. ? Liver disease.  Are obese. What are the signs or symptoms? Symptoms of this condition can range from mild to severe. Symptoms may appear any time from 2 to 14 days after being exposed to the virus. They include:  A fever or chills.  A cough.  Difficulty breathing.  Headaches, body aches, or muscle aches.  Runny or stuffy (congested) nose.  A sore throat.  New loss of taste or smell. Some people may also have stomach problems, such as nausea, vomiting, or diarrhea. Other people may not have any symptoms of COVID-19. How is this diagnosed? This condition may be diagnosed based on:  Your signs and symptoms, especially if: ? You live in an area with a COVID-19 outbreak. ? You recently traveled to or from an area where the virus is common. ? You provide care for or live with a person who was diagnosed with COVID-19. ? You were exposed to a person who was diagnosed with COVID-19.  A physical exam.  Lab tests, which may include: ? Taking a sample of fluid from the back of your nose and throat (nasopharyngeal fluid), your nose, or your throat using a swab. ? A sample of mucus from your lungs (sputum). ? Blood tests.  Imaging tests, which may include, X-rays, CT scan, or ultrasound. How is this treated? At present,  there is no medicine to treat COVID-19. Medicines that treat other diseases are being used on a trial basis to see if they are effective against COVID-19. Your health care provider will talk with you about ways to treat your symptoms. For most people, the infection is mild and can be managed at home with rest, fluids, and over-the-counter medicines. Treatment for a serious infection usually takes places in a hospital intensive care unit (ICU). It may include one or  more of the following treatments. These treatments are given until your symptoms improve.  Receiving fluids and medicines through an IV.  Supplemental oxygen. Extra oxygen is given through a tube in the nose, a face mask, or a hood.  Positioning you to lie on your stomach (prone position). This makes it easier for oxygen to get into the lungs.  Continuous positive airway pressure (CPAP) or bi-level positive airway pressure (BPAP) machine. This treatment uses mild air pressure to keep the airways open. A tube that is connected to a motor delivers oxygen to the body.  Ventilator. This treatment moves air into and out of the lungs by using a tube that is placed in your windpipe.  Tracheostomy. This is a procedure to create a hole in the neck so that a breathing tube can be inserted.  Extracorporeal membrane oxygenation (ECMO). This procedure gives the lungs a chance to recover by taking over the functions of the heart and lungs. It supplies oxygen to the body and removes carbon dioxide. Follow these instructions at home: Lifestyle  If you are sick, stay home except to get medical care. Your health care provider will tell you how long to stay home. Call your health care provider before you go for medical care.  Rest at home as told by your health care provider.  Do not use any products that contain nicotine or tobacco, such as cigarettes, e-cigarettes, and chewing tobacco. If you need help quitting, ask your health care  provider.  Return to your normal activities as told by your health care provider. Ask your health care provider what activities are safe for you. General instructions  Take over-the-counter and prescription medicines only as told by your health care provider.  Drink enough fluid to keep your urine pale yellow.  Keep all follow-up visits as told by your health care provider. This is important. How is this prevented?  There is no vaccine to help prevent COVID-19 infection. However, there are steps you can take to protect yourself and others from this virus. To protect yourself:   Do not travel to areas where COVID-19 is a risk. The areas where COVID-19 is reported change often. To identify high-risk areas and travel restrictions, check the CDC travel website: FatFares.com.br  If you live in, or must travel to, an area where COVID-19 is a risk, take precautions to avoid infection. ? Stay away from people who are sick. ? Wash your hands often with soap and water for 20 seconds. If soap and water are not available, use an alcohol-based hand sanitizer. ? Avoid touching your mouth, face, eyes, or nose. ? Avoid going out in public, follow guidance from your state and local health authorities. ? If you must go out in public, wear a cloth face covering or face mask. Make sure your mask covers your nose and mouth. ? Avoid crowded indoor spaces. Stay at least 6 feet (2 meters) away from others. ? Disinfect objects and surfaces that are frequently touched every day. This may include:  Counters and tables.  Doorknobs and light switches.  Sinks and faucets.  Electronics, such as phones, remote controls, keyboards, computers, and tablets. To protect others: If you have symptoms of COVID-19, take steps to prevent the virus from spreading to others.  If you think you have a COVID-19 infection, contact your health care provider right away. Tell your health care team that you think you  may have a COVID-19 infection.  Stay home. Leave your house only to seek  medical care. Do not use public transport.  Do not travel while you are sick.  Wash your hands often with soap and water for 20 seconds. If soap and water are not available, use alcohol-based hand sanitizer.  Stay away from other members of your household. Let healthy household members care for children and pets, if possible. If you have to care for children or pets, wash your hands often and wear a mask. If possible, stay in your own room, separate from others. Use a different bathroom.  Make sure that all people in your household wash their hands well and often.  Cough or sneeze into a tissue or your sleeve or elbow. Do not cough or sneeze into your hand or into the air.  Wear a cloth face covering or face mask. Make sure your mask covers your nose and mouth. Where to find more information  Centers for Disease Control and Prevention: PurpleGadgets.be  World Health Organization: https://www.castaneda.info/ Contact a health care provider if:  You live in or have traveled to an area where COVID-19 is a risk and you have symptoms of the infection.  You have had contact with someone who has COVID-19 and you have symptoms of the infection. Get help right away if:  You have trouble breathing.  You have pain or pressure in your chest.  You have confusion.  You have bluish lips and fingernails.  You have difficulty waking from sleep.  You have symptoms that get worse. These symptoms may represent a serious problem that is an emergency. Do not wait to see if the symptoms will go away. Get medical help right away. Call your local emergency services (911 in the [place] Do not drive yourself to the hospital. Let the emergency medical personnel know if you think you have COVID-19. Summary  COVID-19 is a respiratory infection that is caused by a virus. It is also known as  coronavirus disease or novel coronavirus. It can cause serious infections, such as pneumonia, acute respiratory distress syndrome, acute respiratory failure, or sepsis.  The virus that causes COVID-19 is contagious. This means that it can spread from person to person through droplets from breathing, speaking, singing, coughing, or sneezing.  You are more likely to develop a serious illness if you are 31 years of age or older, have a weak immune system, live in a nursing home, or have chronic disease.  There is no medicine to treat COVID-19. Your health care provider will talk with you about ways to treat your symptoms.  Take steps to protect yourself and others from infection. Wash your hands often and disinfect objects and surfaces that are frequently touched every day. Stay away from people who are sick and wear a mask if you are sick. This information is not intended to replace advice given to you by your health care provider. Make sure you discuss any questions you have with your health care provider. Document Revised: 02/26/2019 Document Reviewed: 06/04/2018 Elsevier Patient Education  2020 [place].  COVID-19: How to Protect Yourself and Others Know how it spreads  There is currently no vaccine to prevent coronavirus disease 2019 (COVID-19).  The best way to prevent illness is to avoid being exposed to this virus.  The virus is thought to spread mainly from person-to-person. ? Between people who are in close contact with one another (within about 6 feet). ? Through respiratory droplets produced when an infected person coughs, sneezes or talks. ? These droplets can land in the mouths or noses  of people who are nearby or possibly be inhaled into the lungs. ? COVID-19 may be spread by people who are not showing symptoms. Everyone should Clean your hands often  Wash your hands often with soap and water for at least 20 seconds especially after you have been in a public place, or  after blowing your nose, coughing, or sneezing.  If soap and water are not readily available, use a hand sanitizer that contains at least 60% alcohol. Cover all surfaces of your hands and rub them together until they feel dry.  Avoid touching your eyes, nose, and mouth with unwashed hands. Avoid close contact  Limit contact with others as much as possible.  Avoid close contact with people who are sick.  Put distance between yourself and other people. ? Remember that some people without symptoms may be able to spread virus. ? This is especially important for people who are at higher risk of getting very [employer] Cover your mouth and nose with a mask when around others  You could spread COVID-19 to others even if you do not feel sick.  Everyone should wear a mask in public settings and when around people not living in their household, especially when social distancing is difficult to maintain. ? Masks should not be placed on young children under age 24, anyone who has trouble breathing, or is unconscious, incapacitated or otherwise unable to remove the mask without assistance.  The mask is meant to protect other people in case you are infected.  Do NOT use a facemask meant for a Research scientist (physical sciences).  Continue to keep about 6 feet between yourself and others. The mask is not a substitute for social distancing. Cover coughs and sneezes  Always cover your mouth and nose with a tissue when you cough or sneeze or use the inside of your elbow.  Throw used tissues in the trash.  Immediately wash your hands with soap and water for at least 20 seconds. If soap and water are not readily available, clean your hands with a hand sanitizer that contains at least 60% alcohol. Clean and disinfect  Clean AND disinfect frequently touched surfaces daily. This includes tables, doorknobs, light switches, countertops, handles,  desks, phones, keyboards, toilets, faucets, and sinks. [employer]  If surfaces are dirty, clean them: Use detergent or soap and water prior to disinfection.  Then, use a household disinfectant. You can see a list of EPA-registered household disinfectants here. [employer] 01/13/2019 This information is not intended to replace advice given to you by your health care provider. Make sure you discuss any questions you have with your health care provider. Document Revised: 01/21/2019 Document Reviewed: 11/19/2018 Elsevier Patient Education  2020 [employer].

## 2020-04-18 NOTE — ED Notes (Signed)
Checked on patient for 80% Sat.  Patient awakens easily, denies difficulty breathing and reports he has sleep apnea.  O2 Sat increased to 95% room air

## 2021-12-03 IMAGING — CR DG CHEST 2V
2 series · 3 of 3 positions shown · non-contrast
Comparison: None.

CLINICAL DATA: Fever, cough.

EXAM:
CHEST - 2 VIEW

[chest pa]
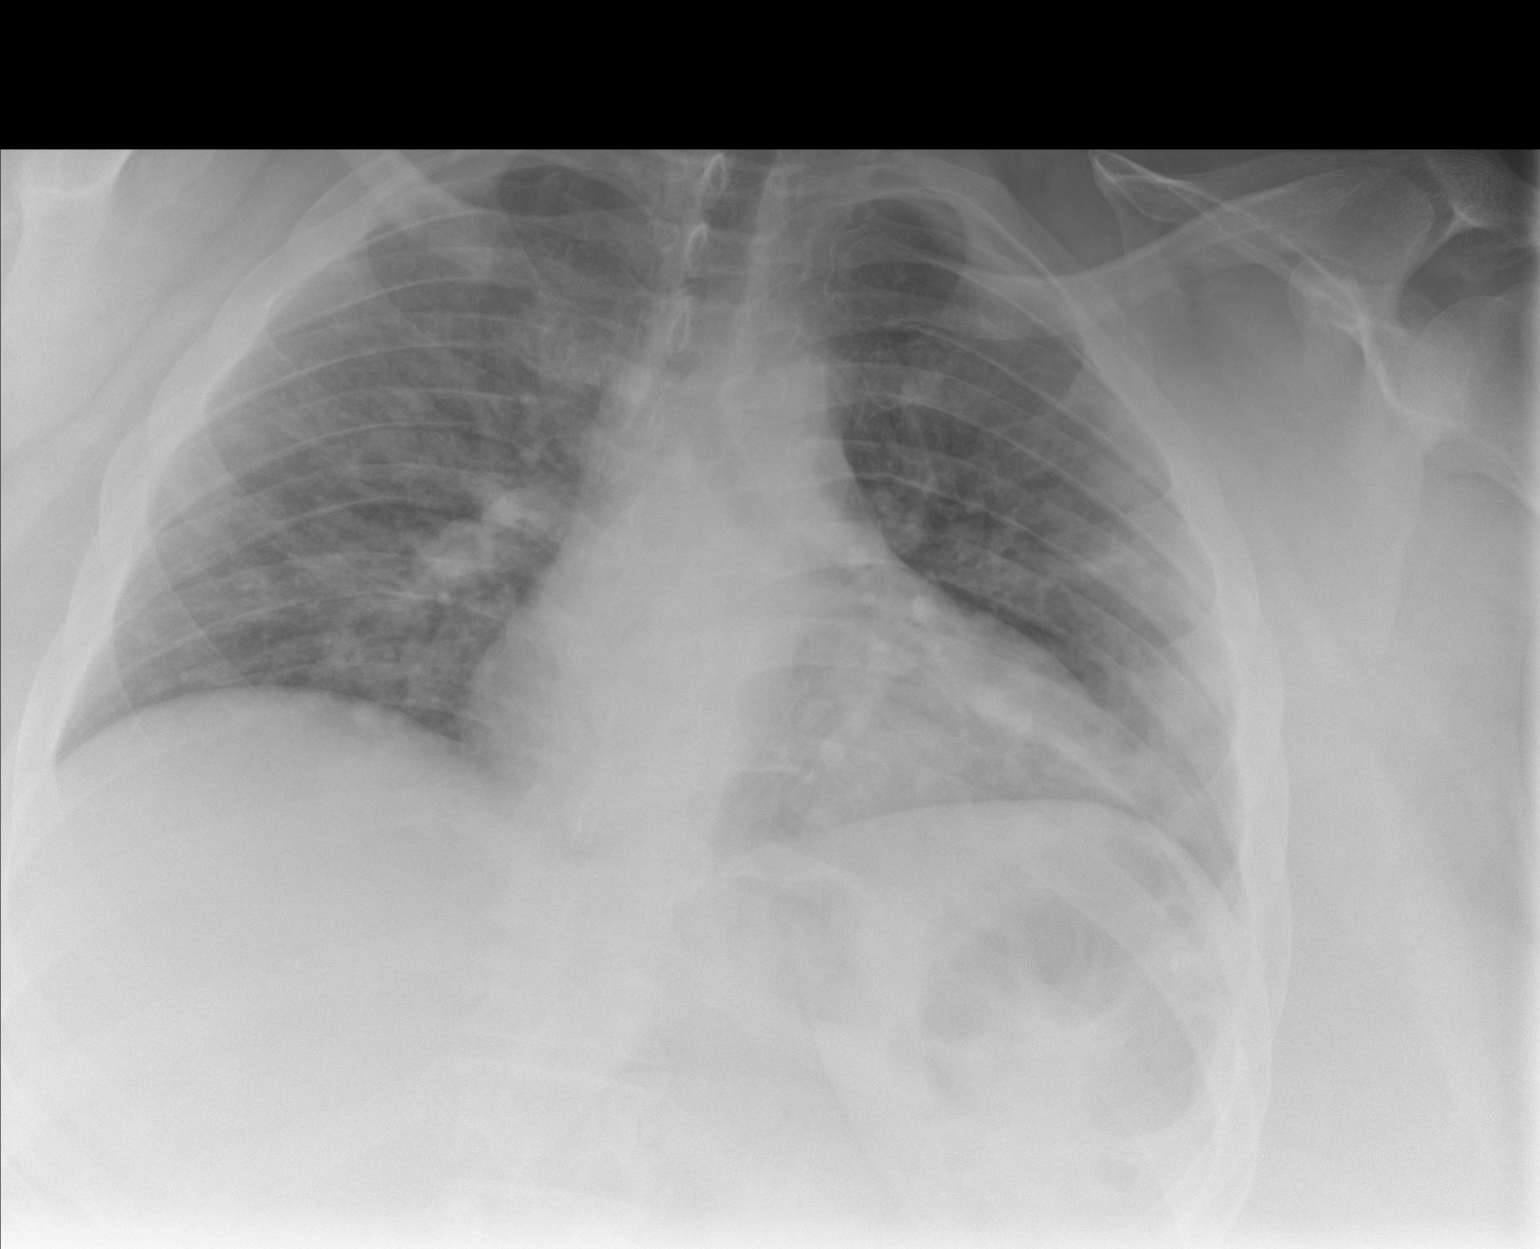

[Series 2: chest lat · 0.14mm/px · 2 of 2 slices shown]
[im 1/2]
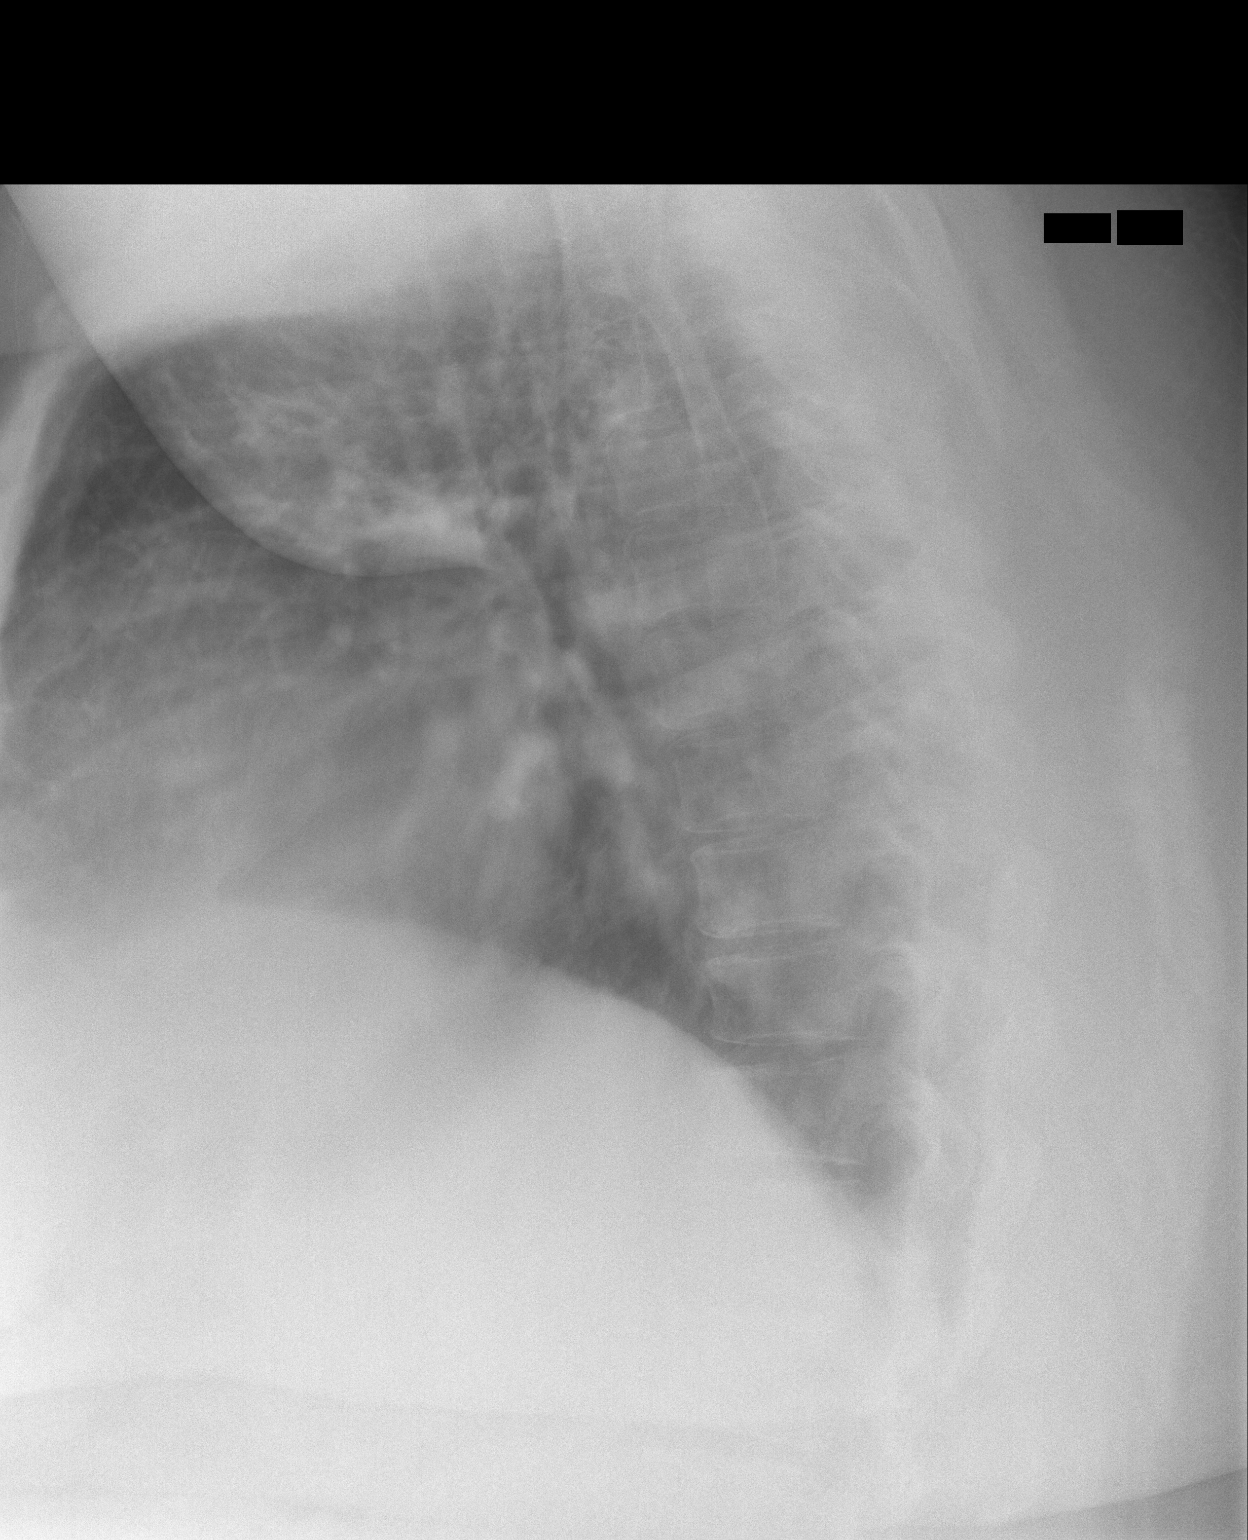
[im 2/2]
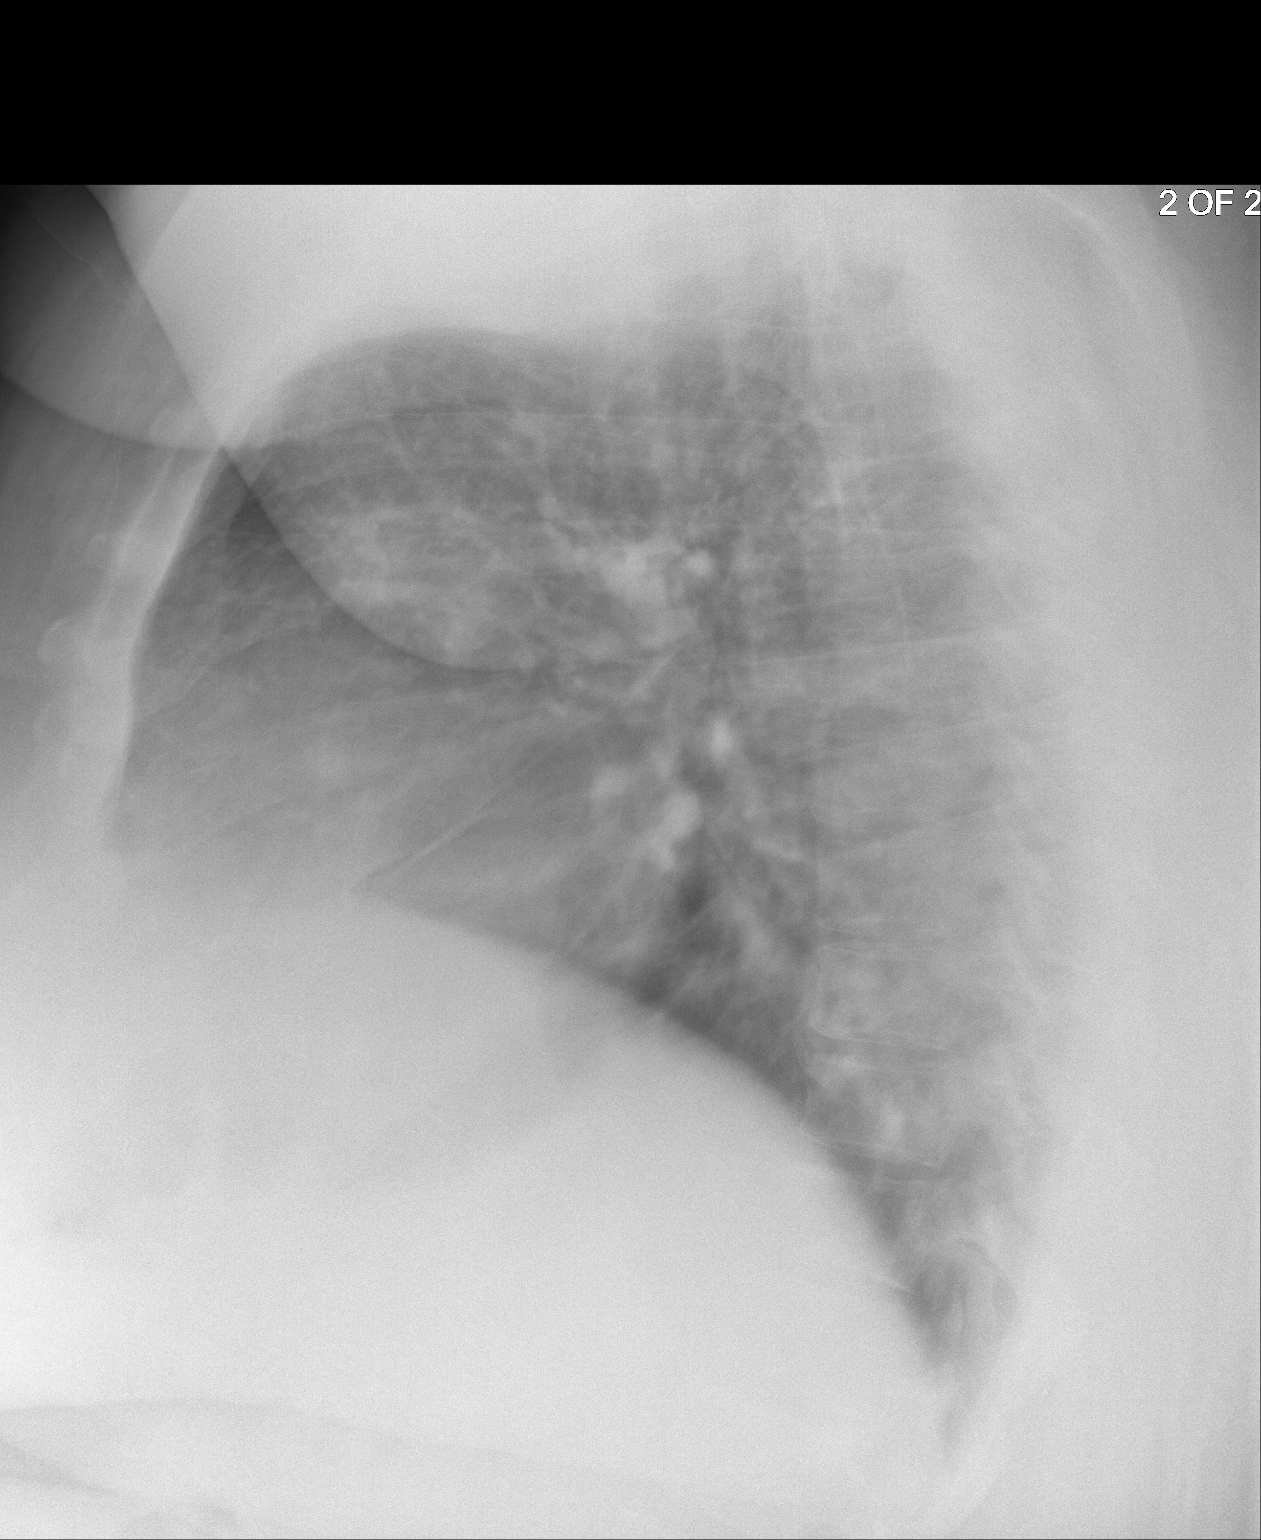

[3 of 3 positions shown; findings below may reference images not displayed]

FINDINGS: The heart size and mediastinal contours are within normal limits. No
pneumothorax or pleural effusion is noted. Minimal patchy opacities
are noted in both lungs which may represent multifocal pneumonia.
The visualized skeletal structures are unremarkable.
IMPRESSION: Minimal patchy opacities are noted in both lungs which may represent
multifocal pneumonia.

## 2023-08-19 ENCOUNTER — Ambulatory Visit: Admitting: Orthopaedic Surgery

## 2023-08-19 ENCOUNTER — Other Ambulatory Visit: Payer: Self-pay

## 2023-08-19 DIAGNOSIS — M25561 Pain in right knee: Secondary | ICD-10-CM

## 2023-08-19 DIAGNOSIS — G8929 Other chronic pain: Secondary | ICD-10-CM

## 2023-08-19 DIAGNOSIS — Z6841 Body Mass Index (BMI) 40.0 and over, adult: Secondary | ICD-10-CM

## 2023-08-19 NOTE — Progress Notes (Signed)
 Office Visit Note   Patient: Derek Payne           Date of Birth: July 28, 1980           MRN: [id number] Visit Date: 08/19/2023              Requested by: No referring provider defined for this encounter. PCP: [facility]   Assessment & Plan: Visit Diagnoses:  1. Chronic pain of right knee   2. BMI 60.0-69.9, adult [facility])     Plan: Derek Payne is a 43 year old gentleman with right knee injury probable MCL sprain.  Will mobilize patient in a hinged knee brace.  Symptomatic treatment as needed.  Crutch as needed.  Range of motion and ambulation are both pretty good.  I do not think he needs any PT.  Will see him back as needed.  The patient meets the AMA guidelines for Morbid (severe) obesity with a BMI > 40.0 and I have recommended weight loss.  Follow-Up Instructions: No follow-ups on file.   Orders:  Orders Placed This Encounter  Procedures   XR KNEE 3 VIEW RIGHT   No orders of the defined types were placed in this encounter.     Procedures: No procedures performed   Clinical Data: No additional findings.   Subjective: Chief Complaint  Patient presents with   Right Knee - Pain    HPI Derek Payne 43 year old gentleman here for evaluation of right knee pain status post fall last week.  Slipped on wet pavement and his right leg went out to the side.  He has been using a single crutch.  He felt a crunching sound.  He has been limping.  Pain comes and goes.  Denies any numbness tingling or burning.  States he has had history of a sports injuries as a child. Review of Systems  Constitutional: Negative.   HENT: Negative.    Eyes: Negative.   Respiratory: Negative.    Cardiovascular: Negative.   Gastrointestinal: Negative.   Endocrine: Negative.   Genitourinary: Negative.   Skin: Negative.   Allergic/Immunologic: Negative.   Neurological: Negative.   Hematological: Negative.   Psychiatric/Behavioral: Negative.    All other systems reviewed and are  negative.    Objective: Vital Signs: There were no vitals taken for this visit.  Physical Exam Vitals and nursing note reviewed.  Constitutional:      Appearance: He is well-developed.  HENT:     Head: Normocephalic and atraumatic.  Eyes:     Pupils: Pupils are equal, round, and reactive to light.  Pulmonary:     Effort: Pulmonary effort is normal.  Abdominal:     Palpations: Abdomen is soft.  Musculoskeletal:        General: Normal range of motion.     Cervical back: Neck supple.  Skin:    General: Skin is warm.  Neurological:     Mental Status: He is alert and oriented to person, place, and time.  Psychiatric:        Behavior: Behavior normal.        Thought Content: Thought content normal.        Judgment: Judgment normal.     Ortho Exam Exam of the right knee shows mild tenderness to the femoral attachment of the MCL.  MCL feels slightly lax with a stable endpoint.  No joint effusion.  Otherwise exam is grossly normal although it is limited by body habitus. Specialty Comments:  No specialty comments available.  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  Early joint space narrowing.    PMFS History: Patient Active Problem List   Diagnosis Date Noted   Acute hypoxemic respiratory failure due to COVID-19 [facility]) 04/17/2020   Past Medical History:  Diagnosis Date   Diabetes mellitus without complication (HCC)    Hypertension     Family History  Problem Relation Age of Onset   Hypertension Mother     Past Surgical History:  Procedure Laterality Date   NO PAST SURGERIES     Social History   Occupational History   Not on file  Tobacco Use   Smoking status: Never   Smokeless tobacco: Never  Vaping Use   Vaping status: Never Used  Substance and Sexual Activity   Alcohol use: No   Drug use: No   Sexual activity: Not on file

## 2023-10-22 DIAGNOSIS — Z125 Encounter for screening for malignant neoplasm of prostate: Secondary | ICD-10-CM | POA: Diagnosis not present

## 2023-10-22 DIAGNOSIS — I10 Essential (primary) hypertension: Secondary | ICD-10-CM | POA: Diagnosis not present

## 2023-10-22 DIAGNOSIS — E119 Type 2 diabetes mellitus without complications: Secondary | ICD-10-CM | POA: Diagnosis not present

## 2023-10-22 DIAGNOSIS — E782 Mixed hyperlipidemia: Secondary | ICD-10-CM | POA: Diagnosis not present

## 2023-10-22 DIAGNOSIS — G4733 Obstructive sleep apnea (adult) (pediatric): Secondary | ICD-10-CM | POA: Diagnosis not present

## 2023-12-15 ENCOUNTER — Ambulatory Visit: Payer: Self-pay | Admitting: Nurse Practitioner

## 2023-12-15 NOTE — Progress Notes (Deleted)
 There were no vitals taken for this visit.   Subjective:    Patient ID: Derek Payne, male    DOB: [birth year], 43 y.o.   MRN: [id number]  HPI: Derek Payne is a 43 y.o. male  No chief complaint on file.  Patient presents to clinic to establish care with new PCP.  Introduced to Publishing rights manager role and practice setting.  All questions answered.  Discussed provider/patient relationship and expectations.  Patient reports a history of ***. Patient denies a history of: Hypertension, Elevated Cholesterol, Diabetes, Thyroid problems, Depression, Anxiety, Neurological problems, and Abdominal problems.   Active Ambulatory Problems    Diagnosis Date Noted   Acute hypoxemic respiratory failure due to COVID-19 [facility]) 04/17/2020   Resolved Ambulatory Problems    Diagnosis Date Noted   No Resolved Ambulatory Problems   Past Medical History:  Diagnosis Date   Diabetes mellitus without complication (HCC)    Hypertension    Past Surgical History:  Procedure Laterality Date   NO PAST SURGERIES     Family History  Problem Relation Age of Onset   Hypertension Mother      Review of Systems  Per HPI unless specifically indicated above     Objective:    There were no vitals taken for this visit.  Wt Readings from Last 3 Encounters:  04/17/20 (!) 440 lb 0.6 oz (199.6 kg)  04/17/20 (!) 440 lb (199.6 kg)  06/17/18 (!) 488 lb (221.4 kg)    Physical Exam  Results for orders placed or performed during the hospital encounter of 04/17/20  Procalcitonin - Baseline   Collection Time: 04/17/20  6:48 PM  Result Value Ref Range   Procalcitonin 0.11 ng/mL  Blood gas, venous   Collection Time: 04/17/20  6:48 PM  Result Value Ref Range   pH, Ven 7.45 (H) 7.250 - 7.430   pCO2, Ven 38 (L) 44.0 - 60.0 mmHg   pO2, Ven 43.0 32.0 - 45.0 mmHg   Bicarbonate 26.4 20.0 - 28.0 mmol/L   Acid-Base Excess 2.4 (H) 0.0 - 2.0 mmol/L   O2 Saturation 81.1 %   Patient temperature 37.0     Collection site VENOUS    Sample type VENOUS   Brain natriuretic peptide   Collection Time: 04/17/20  6:48 PM  Result Value Ref Range   B Natriuretic Peptide 19.1 0.0 - 100.0 pg/mL  Magnesium   Collection Time: 04/17/20  6:48 PM  Result Value Ref Range   Magnesium 2.0 1.7 - 2.4 mg/dL  Troponin I (High Sensitivity)   Collection Time: 04/17/20  6:48 PM  Result Value Ref Range   Troponin I (High Sensitivity) 55 (H) <18 ng/L  Troponin I (High Sensitivity)   Collection Time: 04/17/20  8:49 PM  Result Value Ref Range   Troponin I (High Sensitivity) 32 (H) <18 ng/L  Hemoglobin A1c   Collection Time: 04/18/20  4:47 AM  Result Value Ref Range   Hgb A1c MFr Bld 5.7 (H) 4.8 - 5.6 %   Mean Plasma Glucose 116.89 mg/dL  HIV Antibody (routine testing w rflx)   Collection Time: 04/18/20  4:47 AM  Result Value Ref Range   HIV Screen 4th Generation wRfx Non Reactive Non Reactive  Comprehensive metabolic panel   Collection Time: 04/18/20  4:47 AM  Result Value Ref Range   Sodium 138 135 - 145 mmol/L   Potassium 3.9 3.5 - 5.1 mmol/L   Chloride 102 98 - 111 mmol/L   CO2 24 22 -  32 mmol/L   Glucose, Bld 124 (H) 70 - 99 mg/dL   BUN 14 6 - 20 mg/dL   Creatinine, Ser 8.52 (H) 0.61 - 1.24 mg/dL   Calcium 8.5 (L) 8.9 - 10.3 mg/dL   Total Protein 7.7 6.5 - 8.1 g/dL   Albumin 3.3 (L) 3.5 - 5.0 g/dL   AST 72 (H) 15 - 41 U/L   ALT 77 (H) 0 - 44 U/L   Alkaline Phosphatase 47 38 - 126 U/L   Total Bilirubin 1.0 0.3 - 1.2 mg/dL   GFR, Estimated >39 >39 mL/min   Anion gap 12 5 - 15  CBC with Differential/Platelet   Collection Time: 04/18/20  4:47 AM  Result Value Ref Range   WBC 4.8 4.0 - 10.5 K/uL   RBC 5.65 4.22 - 5.81 MIL/uL   Hemoglobin 15.2 13.0 - 17.0 g/dL   HCT 52.6 60.9 - 47.9 %   MCV 83.7 80.0 - 100.0 fL   MCH 26.9 26.0 - 34.0 pg   MCHC 32.1 30.0 - 36.0 g/dL   RDW 85.4 88.4 - 84.4 %   Platelets 194 150 - 400 K/uL   nRBC 0.0 0.0 - 0.2 %   Neutrophils Relative % 70 %   Neutro Abs 3.4  1.7 - 7.7 K/uL   Lymphocytes Relative 21 %   Lymphs Abs 1.0 0.7 - 4.0 K/uL   Monocytes Relative 8 %   Monocytes Absolute 0.4 0.1 - 1.0 K/uL   Eosinophils Relative 0 %   Eosinophils Absolute 0.0 0.0 - 0.5 K/uL   Basophils Relative 0 %   Basophils Absolute 0.0 0.0 - 0.1 K/uL   WBC Morphology MORPHOLOGY UNREMARKABLE    RBC Morphology MORPHOLOGY UNREMARKABLE    Smear Review Normal platelet morphology    Immature Granulocytes 1 %   Abs Immature Granulocytes 0.03 0.00 - 0.07 K/uL  Fibrin derivatives D-Dimer ([facility] only)   Collection Time: 04/18/20  4:47 AM  Result Value Ref Range   Fibrin derivatives D-dimer ([facility]) 769.33 (H) 0.00 - 499.00 ng/mL (FEU)  C-reactive protein   Collection Time: 04/18/20  4:47 AM  Result Value Ref Range   CRP 7.4 (H) <1.0 mg/dL  CBG monitoring, ED   Collection Time: 04/18/20  8:54 AM  Result Value Ref Range   Glucose-Capillary 108 (H) 70 - 99 mg/dL   Comment 1 Document in Chart   CBG monitoring, ED   Collection Time: 04/18/20 11:47 AM  Result Value Ref Range   Glucose-Capillary 123 (H) 70 - 99 mg/dL      Assessment & Plan:   Problem List Items Addressed This Visit   None    Follow up plan: No follow-ups on file.
